# Patient Record
Sex: Female | Born: 1955 | Race: White | Hispanic: No | Marital: Married | Smoking: Never smoker
Health system: Southern US, Community
[De-identification: ages and names within clinical notes are randomized; demographics above are authoritative.]

## PROBLEM LIST (undated history)

## (undated) DIAGNOSIS — S82142A Displaced bicondylar fracture of left tibia, initial encounter for closed fracture: Secondary | ICD-10-CM

## (undated) DIAGNOSIS — T7840XA Allergy, unspecified, initial encounter: Secondary | ICD-10-CM

## (undated) DIAGNOSIS — Z1211 Encounter for screening for malignant neoplasm of colon: Secondary | ICD-10-CM

## (undated) DIAGNOSIS — C439 Malignant melanoma of skin, unspecified: Secondary | ICD-10-CM

## (undated) DIAGNOSIS — N6019 Diffuse cystic mastopathy of unspecified breast: Secondary | ICD-10-CM

## (undated) DIAGNOSIS — Z85828 Personal history of other malignant neoplasm of skin: Secondary | ICD-10-CM

## (undated) DIAGNOSIS — Z8719 Personal history of other diseases of the digestive system: Secondary | ICD-10-CM

## (undated) HISTORY — PX: TONSILLECTOMY AND ADENOIDECTOMY: SUR1326

## (undated) HISTORY — DX: Malignant melanoma of skin, unspecified: C43.9

## (undated) HISTORY — DX: Encounter for screening for malignant neoplasm of colon: Z12.11

## (undated) HISTORY — PX: APPENDECTOMY: SHX54

## (undated) HISTORY — PX: TUBAL LIGATION: SHX77

## (undated) HISTORY — PX: COLONOSCOPY: SHX174

## (undated) HISTORY — DX: Personal history of other diseases of the digestive system: Z87.19

## (undated) HISTORY — DX: Allergy, unspecified, initial encounter: T78.40XA

## (undated) HISTORY — DX: Diffuse cystic mastopathy of unspecified breast: N60.19

## (undated) HISTORY — PX: BREAST BIOPSY: SHX20

## (undated) HISTORY — DX: Personal history of other malignant neoplasm of skin: Z85.828

---

## 1999-11-15 HISTORY — PX: BREAST CYST ASPIRATION: SHX578

## 2001-11-14 DIAGNOSIS — Z8719 Personal history of other diseases of the digestive system: Secondary | ICD-10-CM

## 2001-11-14 HISTORY — DX: Personal history of other diseases of the digestive system: Z87.19

## 2002-07-26 ENCOUNTER — Ambulatory Visit (HOSPITAL_COMMUNITY): Admission: RE | Admit: 2002-07-26 | Discharge: 2002-07-26 | Payer: Self-pay | Admitting: Obstetrics and Gynecology

## 2002-07-26 ENCOUNTER — Encounter: Payer: Self-pay | Admitting: Obstetrics and Gynecology

## 2002-10-14 ENCOUNTER — Ambulatory Visit (HOSPITAL_COMMUNITY): Admission: RE | Admit: 2002-10-14 | Discharge: 2002-10-14 | Payer: Self-pay | Admitting: *Deleted

## 2002-10-17 ENCOUNTER — Encounter: Payer: Self-pay | Admitting: *Deleted

## 2002-10-17 ENCOUNTER — Ambulatory Visit (HOSPITAL_COMMUNITY): Admission: RE | Admit: 2002-10-17 | Discharge: 2002-10-17 | Payer: Self-pay | Admitting: *Deleted

## 2003-01-22 ENCOUNTER — Other Ambulatory Visit: Admission: RE | Admit: 2003-01-22 | Discharge: 2003-01-22 | Payer: Self-pay | Admitting: Obstetrics and Gynecology

## 2003-04-14 ENCOUNTER — Ambulatory Visit (HOSPITAL_COMMUNITY): Admission: RE | Admit: 2003-04-14 | Discharge: 2003-04-14 | Payer: Self-pay | Admitting: Internal Medicine

## 2003-04-14 ENCOUNTER — Encounter: Payer: Self-pay | Admitting: Internal Medicine

## 2004-03-01 ENCOUNTER — Other Ambulatory Visit: Admission: RE | Admit: 2004-03-01 | Discharge: 2004-03-01 | Payer: Self-pay | Admitting: Obstetrics and Gynecology

## 2004-08-31 ENCOUNTER — Ambulatory Visit: Payer: Self-pay | Admitting: Internal Medicine

## 2004-09-07 ENCOUNTER — Ambulatory Visit: Payer: Self-pay | Admitting: General Surgery

## 2004-11-01 ENCOUNTER — Ambulatory Visit: Payer: Self-pay | Admitting: Internal Medicine

## 2005-03-03 ENCOUNTER — Other Ambulatory Visit: Admission: RE | Admit: 2005-03-03 | Discharge: 2005-03-03 | Payer: Self-pay | Admitting: Obstetrics and Gynecology

## 2005-09-20 ENCOUNTER — Ambulatory Visit: Payer: Self-pay | Admitting: General Surgery

## 2006-01-16 ENCOUNTER — Encounter: Admission: RE | Admit: 2006-01-16 | Discharge: 2006-01-16 | Payer: Self-pay | Admitting: Gastroenterology

## 2006-09-28 ENCOUNTER — Ambulatory Visit: Payer: Self-pay | Admitting: General Surgery

## 2007-08-02 ENCOUNTER — Ambulatory Visit: Payer: Self-pay | Admitting: Gastroenterology

## 2007-10-16 ENCOUNTER — Ambulatory Visit: Payer: Self-pay | Admitting: General Surgery

## 2007-11-15 HISTORY — PX: LAPAROSCOPY: SHX197

## 2007-11-15 HISTORY — PX: COLON SURGERY: SHX602

## 2007-11-23 ENCOUNTER — Ambulatory Visit: Payer: Self-pay | Admitting: General Surgery

## 2007-12-11 ENCOUNTER — Ambulatory Visit: Payer: Self-pay | Admitting: General Surgery

## 2008-01-21 ENCOUNTER — Ambulatory Visit: Payer: Self-pay | Admitting: General Surgery

## 2008-01-31 ENCOUNTER — Ambulatory Visit: Payer: Self-pay | Admitting: General Surgery

## 2008-02-06 ENCOUNTER — Inpatient Hospital Stay: Payer: Self-pay | Admitting: General Surgery

## 2008-02-16 IMAGING — CT CT ABD-PELV W/ CM
1 of 2 series · 15 of 32 positions shown, 20 images · non-contrast
Comparison: none

REASON FOR EXAM: LLQ abdominal pain
COMMENTS:

PROCEDURE:     GOOD - GOOD ABDOMEN / PELVIS W  - November 23, 2007  [DATE]
RESULT:     The patient has a history of pelvic pain, diverticulitis,
history of prior appendectomy and hysterectomy.
There are no prior studies available for comparison.

[Series 2: soft tissue · axial · 0.59mm/px · z∈[+357,+749]mm · 15 of 55 slices shown, 20 images]
[im 3/55  soft-tissue]
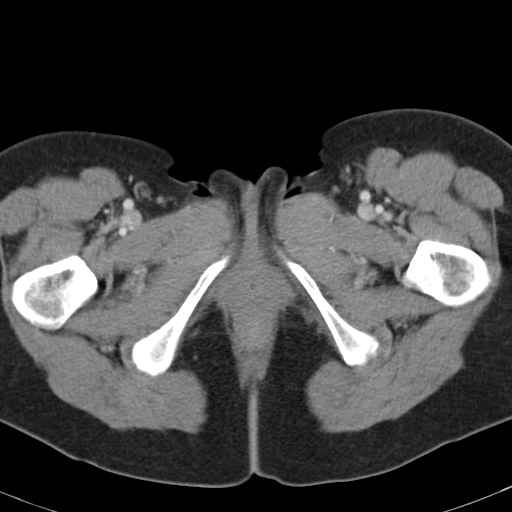
[im 3/55  bone]
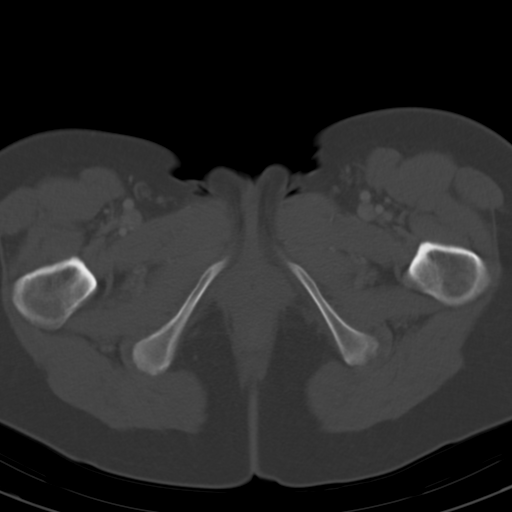
[im 8/55  soft-tissue]
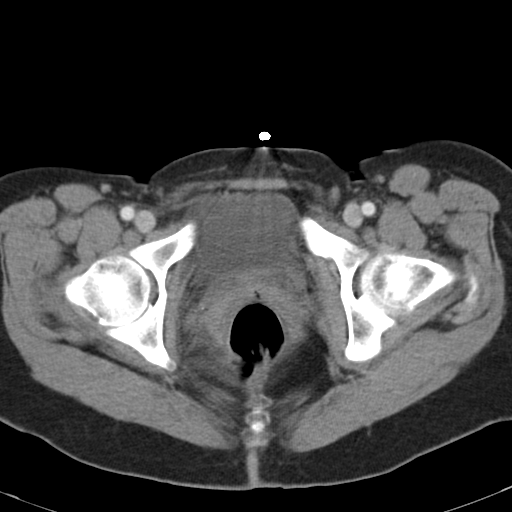
[im 10/55  soft-tissue]
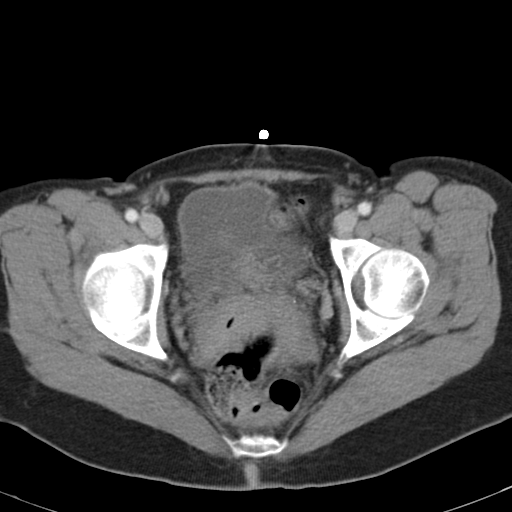
[im 15/55  soft-tissue]
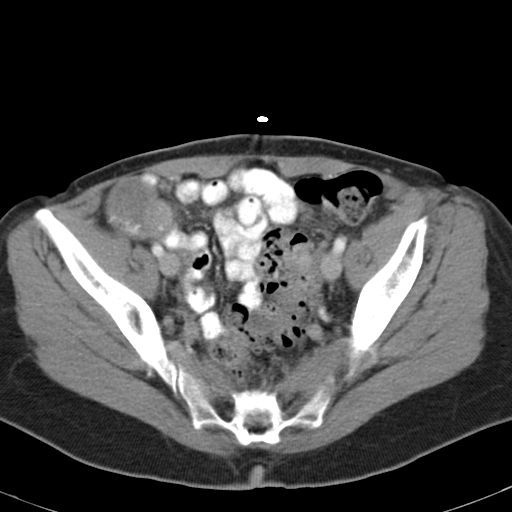
[im 18/55  soft-tissue]
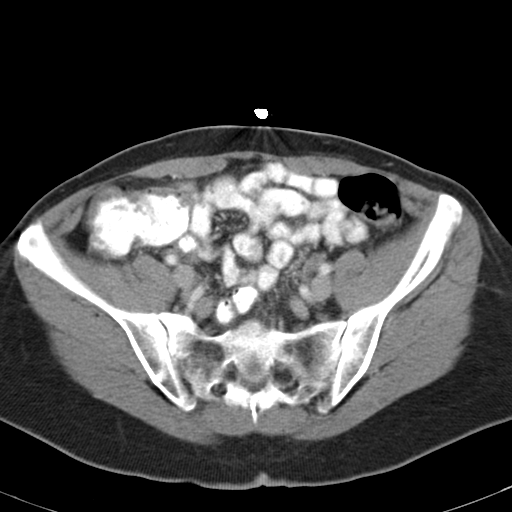
[im 23/55  soft-tissue]
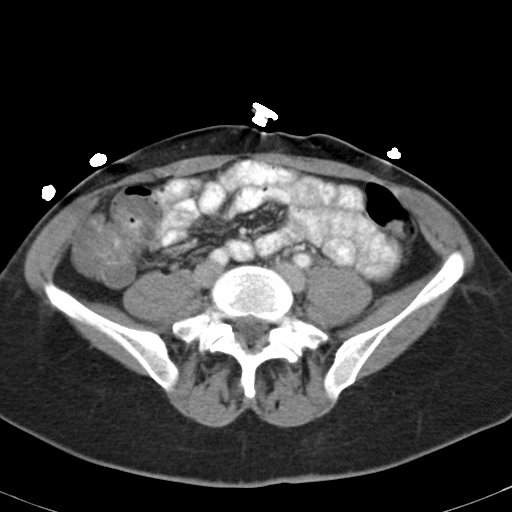
[im 25/55  soft-tissue]
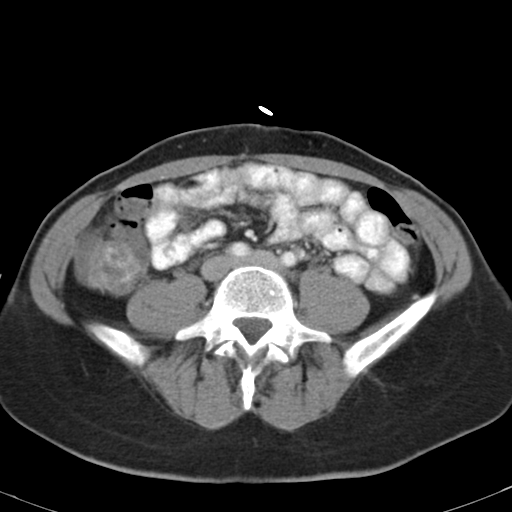
[im 30/55  soft-tissue]
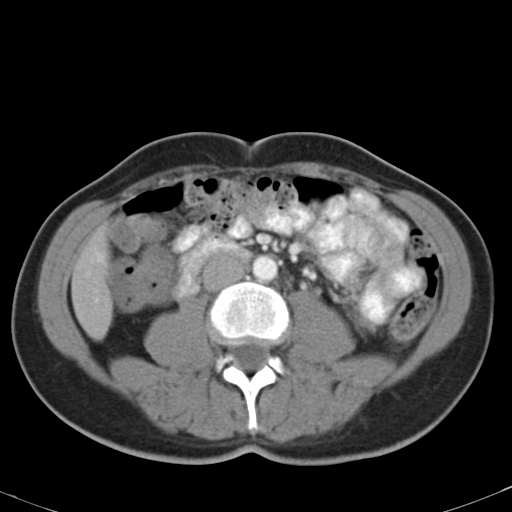
[im 32/55  soft-tissue]
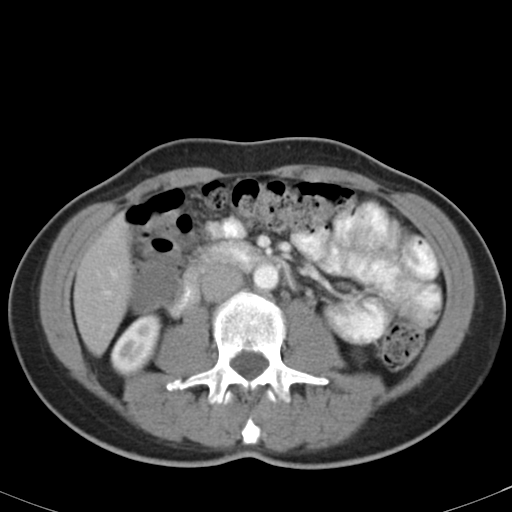
[im 32/55  bone]
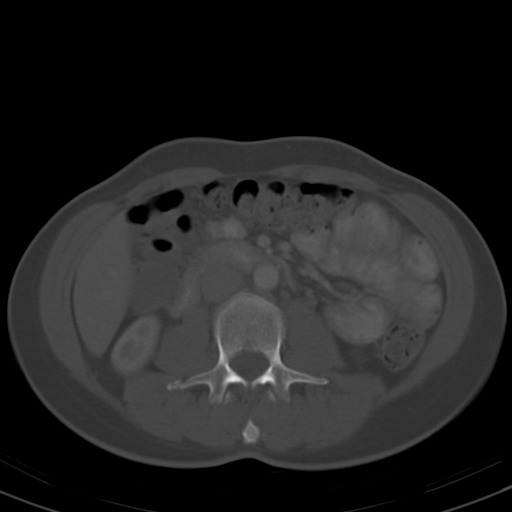
[im 37/55  soft-tissue]
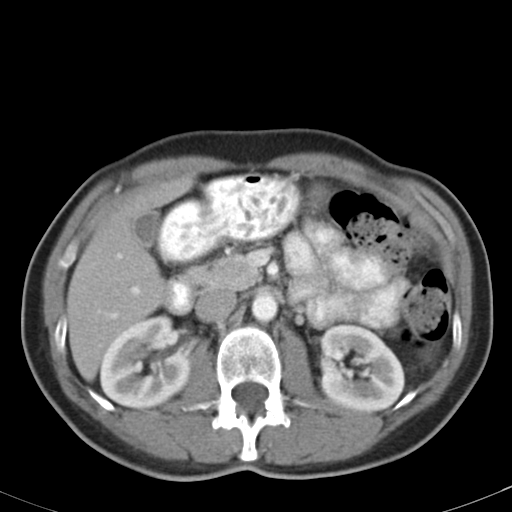
[im 40/55  soft-tissue]
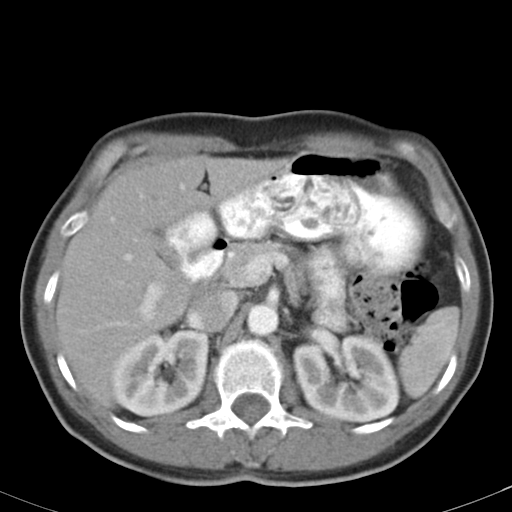
[im 45/55  soft-tissue]
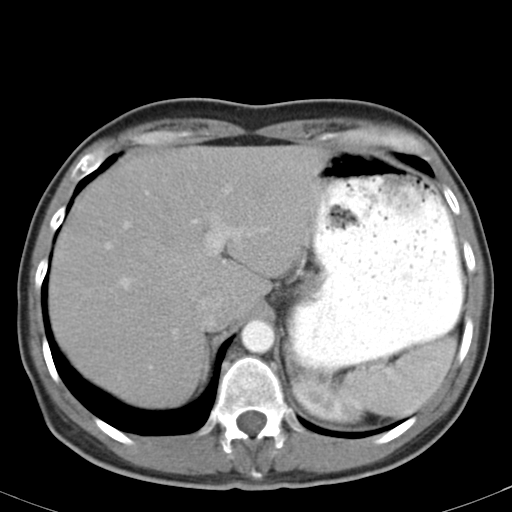
[im 45/55  lung]
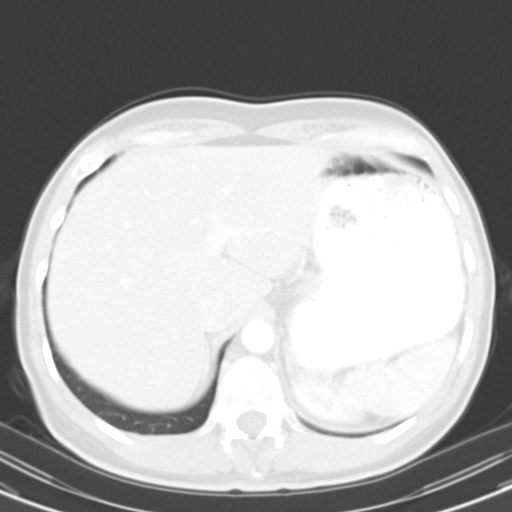
[im 47/55  soft-tissue]
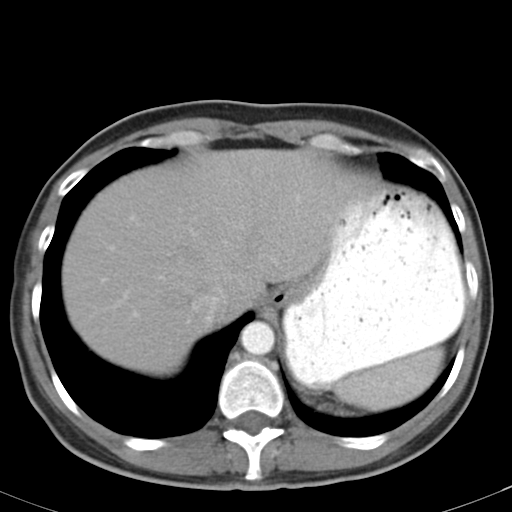
[im 47/55  lung]
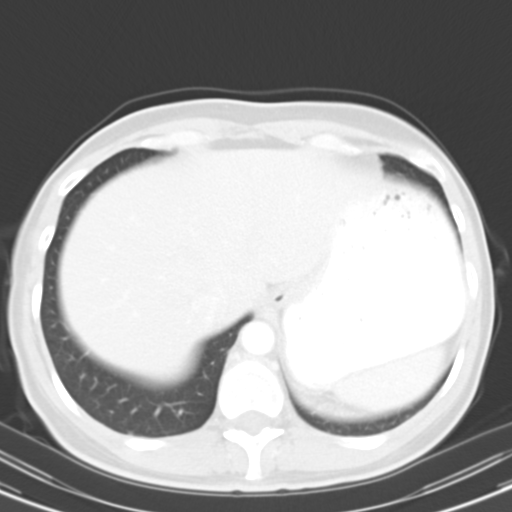
[im 50/55  lung]
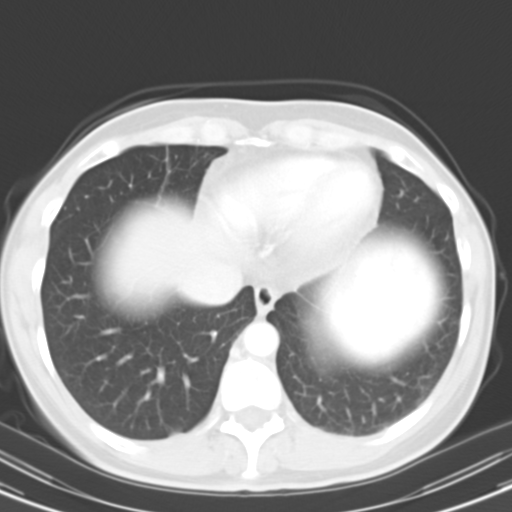
[im 52/55  soft-tissue]
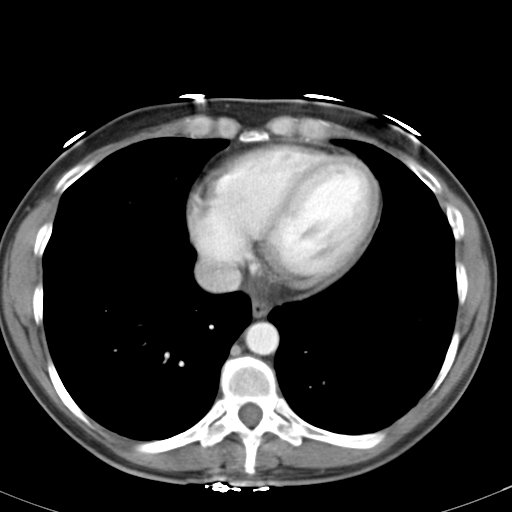
[im 52/55  lung]
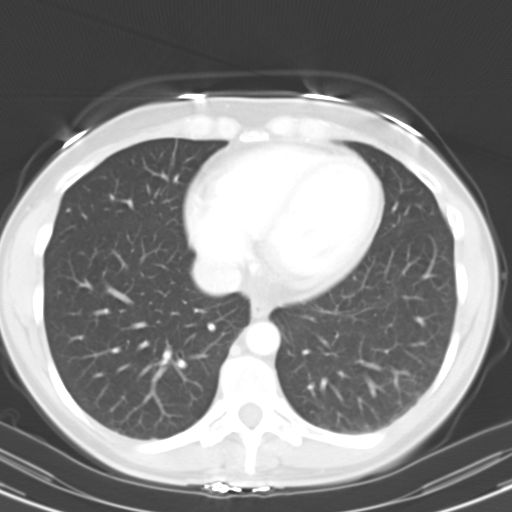

[15 of 32 positions shown; findings below may reference images not displayed]

FINDINGS: The liver and spleen are normal. The pancreas is normal. The
adrenals are normal. No focal renal abnormalities are identified. The
gallbladder is nondistended. Debris is noted in the stomach. There is no
bowel distension. No inguinal adenopathy is noted. Sigmoid colonic
diverticulosis is noted.
IMPRESSION: 1.     No acute abnormality identified.
2.     Sigmoid colonic diverticulosis.

## 2008-10-16 ENCOUNTER — Ambulatory Visit: Payer: Self-pay | Admitting: General Surgery

## 2009-07-07 ENCOUNTER — Ambulatory Visit: Payer: Self-pay | Admitting: Internal Medicine

## 2009-10-20 ENCOUNTER — Ambulatory Visit: Payer: Self-pay | Admitting: General Surgery

## 2009-10-27 ENCOUNTER — Ambulatory Visit: Payer: Self-pay | Admitting: General Surgery

## 2010-06-04 ENCOUNTER — Ambulatory Visit: Payer: Self-pay | Admitting: Internal Medicine

## 2010-08-28 IMAGING — CR CERVICAL SPINE - COMPLETE 4+ VIEW
1 series · 6 of 6 positions shown · non-contrast
Comparison: none

REASON FOR EXAM: neck pain
COMMENTS:

[Series 1: view not recorded · 0.17mm/px · 6 of 6 slices shown]
[im 1/6]
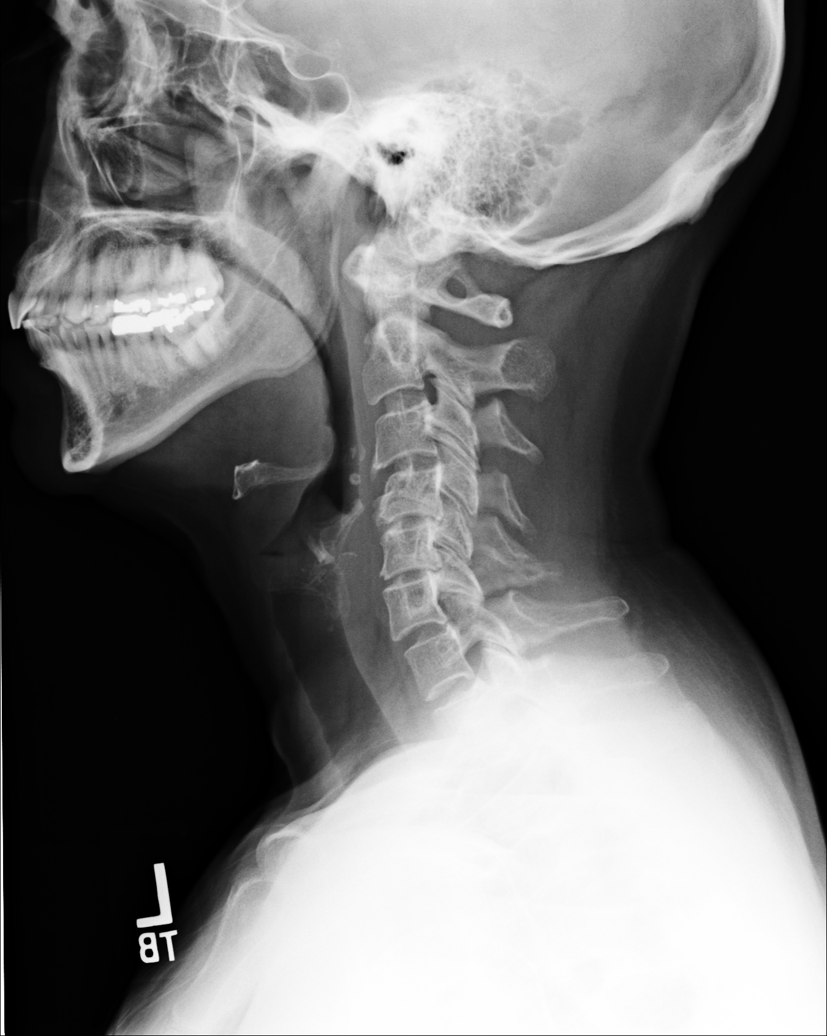
[im 2/6]
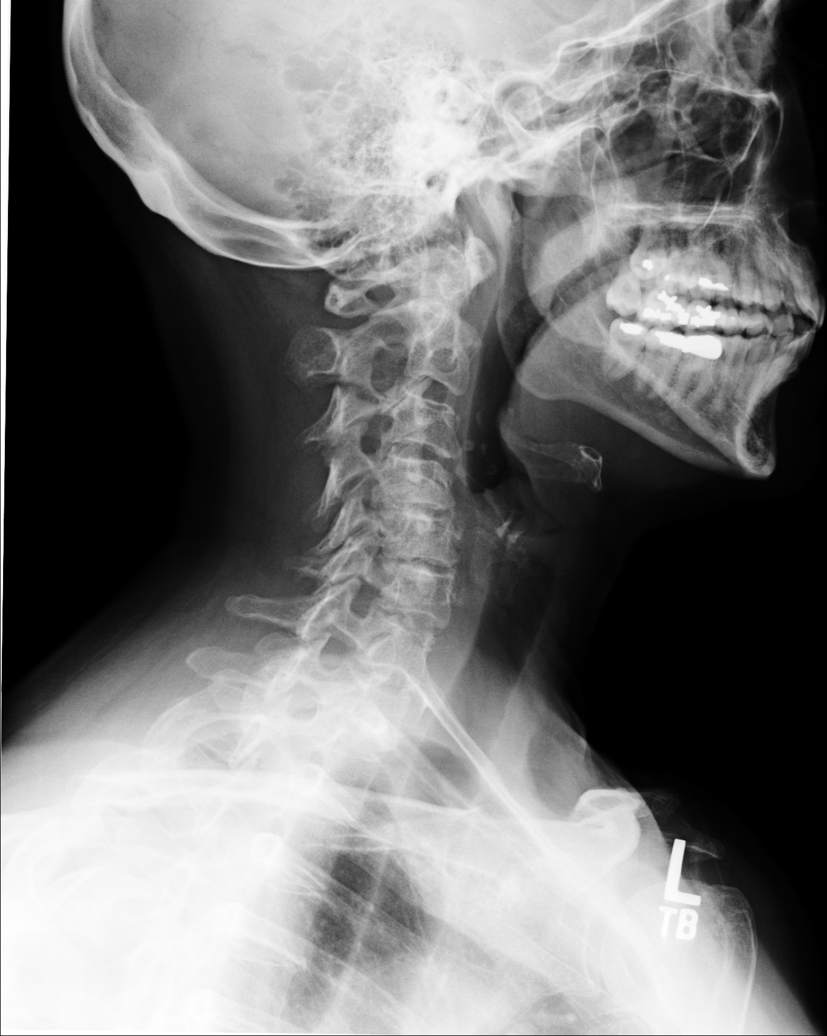
[im 3/6]
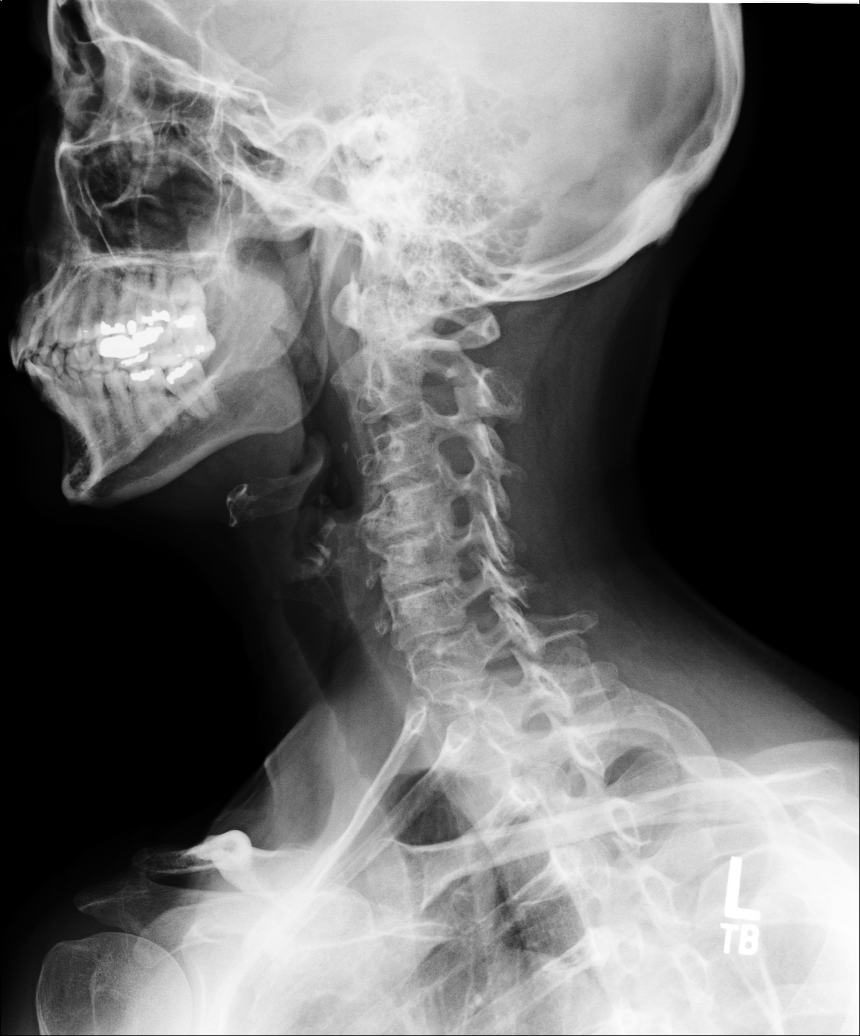
[im 4/6]
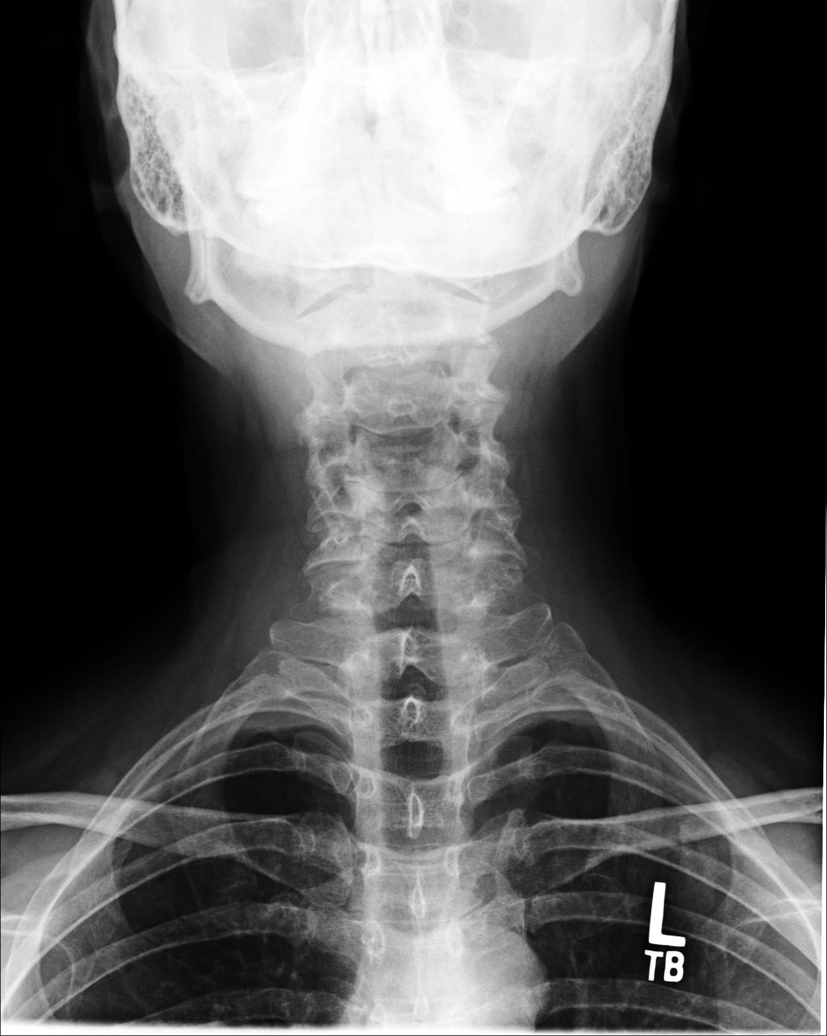
[im 5/6]
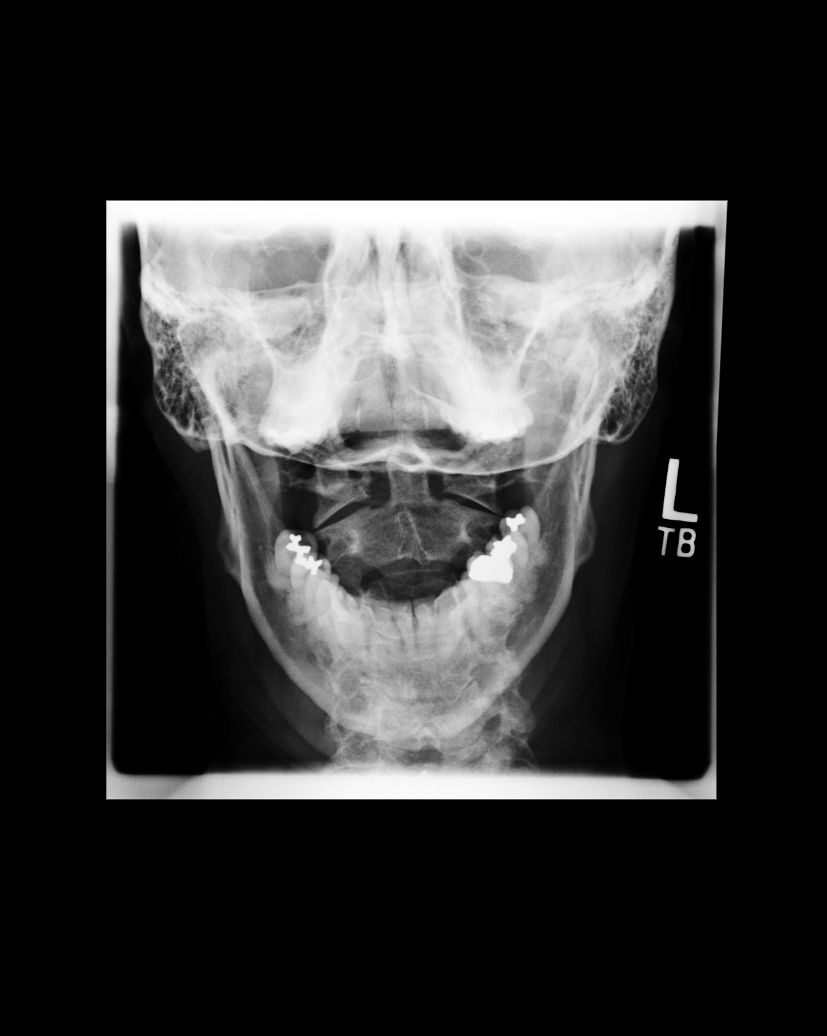
[im 6/6]
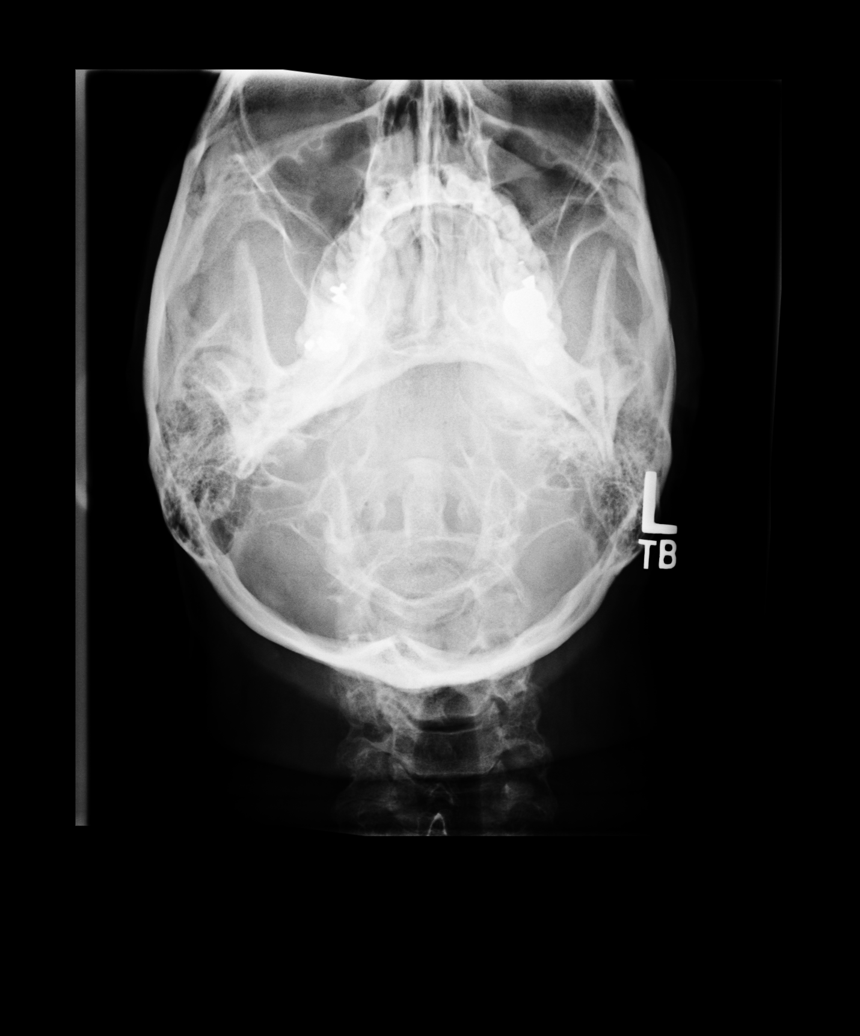

[6 of 6 positions shown; findings below may reference images not displayed]

PROCEDURE:     DXR - DXR CERVICAL SPINE COMPLETE  - June 04, 2010  [DATE]

RESULT:     The vertebral body heights are well maintained. The vertebral
body alignment is normal. There is narrowing of the C4-5 and C5-6 cervical
disc spaces compatible with cervical disc disease. There is spur impingement
on the neural foramina at both levels on the left. The neural foramina
otherwise are widely patent bilaterally. The odontoid process is intact. No
cervical rib formation is seen. In the lateral view, there is noted reversal
of the usual lordotic curvature. This is a nonspecific finding which can be
chronic or secondary to cervical muscle spasm.
IMPRESSION: 1.  No fracture is seen.
2.  There is mild narrowing of the C4-5 and C5-6 cervical disc spaces
compatible with cervical disc disease and with there being spur impingement
at both levels of the neural foramina on the right.
3.  There is reversal of the usual lordotic curvature as noted above.
4.  No cervical rib formation is seen.
5.  No lytic or blastic lesions are noted.

## 2010-10-28 ENCOUNTER — Ambulatory Visit: Payer: Self-pay | Admitting: General Surgery

## 2010-11-01 ENCOUNTER — Ambulatory Visit: Payer: Self-pay | Admitting: General Surgery

## 2011-11-02 ENCOUNTER — Ambulatory Visit: Payer: Self-pay | Admitting: General Surgery

## 2011-11-15 DIAGNOSIS — C439 Malignant melanoma of skin, unspecified: Secondary | ICD-10-CM

## 2011-11-15 HISTORY — PX: MELANOMA EXCISION: SHX5266

## 2011-11-15 HISTORY — DX: Malignant melanoma of skin, unspecified: C43.9

## 2011-11-15 HISTORY — PX: INGUINAL LYMPH NODE BIOPSY: SHX5865

## 2012-11-05 ENCOUNTER — Ambulatory Visit: Payer: Self-pay | Admitting: General Surgery

## 2012-11-14 DIAGNOSIS — Z85828 Personal history of other malignant neoplasm of skin: Secondary | ICD-10-CM

## 2012-11-14 HISTORY — DX: Personal history of other malignant neoplasm of skin: Z85.828

## 2013-05-13 ENCOUNTER — Encounter: Payer: Self-pay | Admitting: *Deleted

## 2013-05-13 DIAGNOSIS — Z8719 Personal history of other diseases of the digestive system: Secondary | ICD-10-CM | POA: Insufficient documentation

## 2013-05-13 DIAGNOSIS — C439 Malignant melanoma of skin, unspecified: Secondary | ICD-10-CM | POA: Insufficient documentation

## 2013-11-18 ENCOUNTER — Encounter: Payer: Self-pay | Admitting: General Surgery

## 2013-11-18 ENCOUNTER — Ambulatory Visit: Payer: Self-pay | Admitting: General Surgery

## 2013-11-25 ENCOUNTER — Ambulatory Visit: Payer: Self-pay | Admitting: General Surgery

## 2013-12-03 ENCOUNTER — Encounter: Payer: Self-pay | Admitting: General Surgery

## 2013-12-03 ENCOUNTER — Other Ambulatory Visit: Payer: BC Managed Care – PPO

## 2013-12-03 ENCOUNTER — Ambulatory Visit (INDEPENDENT_AMBULATORY_CARE_PROVIDER_SITE_OTHER): Payer: BC Managed Care – PPO | Admitting: General Surgery

## 2013-12-03 VITALS — BP 130/72 | HR 72 | Resp 12 | Ht 63.0 in | Wt 142.0 lb

## 2013-12-03 DIAGNOSIS — N6019 Diffuse cystic mastopathy of unspecified breast: Secondary | ICD-10-CM

## 2013-12-03 DIAGNOSIS — Z8371 Family history of colonic polyps: Secondary | ICD-10-CM

## 2013-12-03 DIAGNOSIS — N63 Unspecified lump in unspecified breast: Secondary | ICD-10-CM

## 2013-12-03 NOTE — Progress Notes (Signed)
Patient ID: BOB EASTWOOD, female   DOB: 25-Feb-1956, 58 y.o.   MRN: [id number]  Chief Complaint  Patient presents with  . Follow-up    mammogram    HPI ROSALIN BUSTER is a 58 y.o. female.  who presents for her annual breast evaluation. The most recent mammogram was done on 11-18-13.  Patient does perform regular self breast checks and gets regular mammograms done.  No new breast issues. Known history of fibrocystic breast disease. She is status post sigmoid resection in 2009.   HPI  Past Medical History  Diagnosis Date  . Allergy     seasonal  . Diffuse cystic mastopathy   . History of diverticulitis of colon 2003  . Special screening for malignant neoplasms, colon   . Personal history of other malignant neoplasm of skin 2014  . Melanoma 2013    excised from left hip region-069mm deep, sn positive. Subsequently had left inguinal node dissection and interferon therapy    Past Surgical History  Procedure Laterality Date  . Melanoma excision Left 2013    excised from hip region-0.41mm deep, sentinel node positive. Subsequently had left inquinal node dissection and interferon therapy  . Inguinal lymph node biopsy Left 2013    left inguinal node dissection,positve sn d/t melanoma left hip region.  . Colonoscopy  2008    Dr. Jamal Collin  . Tubal ligation    . Tonsillectomy and adenoidectomy    . Cesarean section    . Appendectomy    . Laparoscopy  2009  . Colon surgery  2009    colectomy-sigmoid, hand assist    Family History  Problem Relation Age of Onset  . Cancer Father     cancer of throat, cancer of lung  . Breast cancer Maternal Aunt   . Breast cancer Maternal Aunt   . Colon polyps Mother     Social History History  Substance Use Topics  . Smoking status: Never Smoker   . Smokeless tobacco: Not on file  . Alcohol Use: Yes     Comment: ocassionally    No Known Allergies  Current Outpatient Prescriptions  Medication Sig Dispense Refill  . Biotin 1000 MCG tablet  Take 1,000 mcg by mouth daily.      . calcium carbonate (OS-CAL) 600 MG TABS tablet Take 600 mg by mouth daily with breakfast.      . cholecalciferol (VITAMIN D) 1000 UNITS tablet Take 1,000 Units by mouth daily.      . clonazePAM (KLONOPIN) 0.5 MG tablet Take 0.5 mg by mouth daily.       . fexofenadine (ALLEGRA ALLERGY) 180 MG tablet Take 180 mg by mouth daily.      Marland Kitchen ibandronate (BONIVA) 150 MG tablet Take 150 mg by mouth every 30 (thirty) days.       . Misc Natural Products (GLUCOSAMINE CHONDROITIN ADV PO) Take by mouth daily.      . Omega-3 Fatty Acids (FISH OIL EXTRA STRENGTH) 435 MG CAPS Take 1,400 mg by mouth daily.      . pantoprazole (PROTONIX) 40 MG tablet       . traMADol (ULTRAM) 50 MG tablet        No current facility-administered medications for this visit.    Review of Systems Review of Systems  Constitutional: Negative.   Respiratory: Negative.   Cardiovascular: Negative.     Blood pressure 130/72, pulse 72, resp. rate 12, height 5\' 3"  (1.6 m), weight 142 lb (64.411 kg).  Physical Exam Physical  Exam  Constitutional: She is oriented to person, place, and time. She appears well-developed and well-nourished.  Eyes: Conjunctivae are normal. No scleral icterus.  Neck: Neck supple.  Cardiovascular: Normal rate, regular rhythm and normal heart sounds.   Mild left lower leg edema. Controlled with compression hose.  Pulmonary/Chest: Effort normal and breath sounds normal. Right breast exhibits no inverted nipple, no mass, no nipple discharge, no skin change and no tenderness. Left breast exhibits no inverted nipple, no mass, no nipple discharge, no skin change and no tenderness.  Mild fullness left breast upper outer quadrant.  Abdominal: Soft. Bowel sounds are normal. There is no hepatosplenomegaly. There is no tenderness. No hernia.  Lymphadenopathy:    She has no cervical adenopathy.    She has no axillary adenopathy.  Neurological: She is alert and oriented to person,  place, and time.  Skin: Skin is warm and dry.    Data Reviewed Mammogram reviewed and stable. [place] left breast in uoq revealed a small oval shaped hypoechoic mass.   Assessment    Mild left lower leg edema. Controlled with compression hose otherwise stable physical exam. History of melanoma left hip, s/p wide excision, node dissection.    PlanFNA of left  Breast mass done today-no fluid noted . Cytology sent    3-4 months follow up if cytology is benign..      Patient will call to schedule colonoscopy once breast issues have resolved. We can go ahead and arrange colonoscopy once patient is ready to schedule.   SANKAR,SEEPLAPUTHUR G 12/10/2013, 6:03 AM

## 2013-12-03 NOTE — Patient Instructions (Addendum)
Continue self breast exams. Call office for any new breast issues or concerns. 

## 2013-12-05 LAB — FINE-NEEDLE ASPIRATION

## 2013-12-06 ENCOUNTER — Telehealth: Payer: Self-pay | Admitting: *Deleted

## 2013-12-06 NOTE — Telephone Encounter (Signed)
Message copied by Dominga Ferry on Fri Dec 06, 2013  8:45 AM ------      Message from: Christene Lye      Created: Fri Dec 06, 2013  8:11 AM       Please inform pt cytology was inconclusive. She needs core biopsy. Please schedule this. ------

## 2013-12-06 NOTE — Telephone Encounter (Signed)
Patient notified as instructed. She verbalizes understanding. This patient has been scheduled for a left breast core biopsy for 12-12-13.

## 2013-12-10 ENCOUNTER — Encounter: Payer: Self-pay | Admitting: General Surgery

## 2013-12-10 DIAGNOSIS — N6019 Diffuse cystic mastopathy of unspecified breast: Secondary | ICD-10-CM | POA: Insufficient documentation

## 2013-12-10 DIAGNOSIS — N63 Unspecified lump in unspecified breast: Secondary | ICD-10-CM | POA: Insufficient documentation

## 2013-12-12 ENCOUNTER — Ambulatory Visit (INDEPENDENT_AMBULATORY_CARE_PROVIDER_SITE_OTHER): Payer: BC Managed Care – PPO | Admitting: General Surgery

## 2013-12-12 ENCOUNTER — Ambulatory Visit: Payer: BC Managed Care – PPO

## 2013-12-12 ENCOUNTER — Encounter: Payer: Self-pay | Admitting: General Surgery

## 2013-12-12 VITALS — BP 140/78 | HR 82 | Resp 14 | Ht 63.0 in | Wt 144.0 lb

## 2013-12-12 DIAGNOSIS — N63 Unspecified lump in unspecified breast: Secondary | ICD-10-CM

## 2013-12-12 NOTE — Progress Notes (Signed)
This is a 58 year old female here today for an left breast core biopsy. FNA from left breast mass was inadequate for evaluation. Core biopsy completed with clip placed.  Assuming path is benign will see her back in 3 mos.

## 2013-12-12 NOTE — Patient Instructions (Signed)

## 2013-12-14 LAB — PATHOLOGY

## 2013-12-17 ENCOUNTER — Telehealth: Payer: Self-pay | Admitting: *Deleted

## 2013-12-17 NOTE — Telephone Encounter (Signed)
Message copied by Carson Myrtle on Tue Dec 17, 2013  8:19 AM ------      Message from: Dominga Ferry      Created: Mon Dec 16, 2013  4:48 PM       Dr. Jamal Collin said he sent you an in basket message on this patient. He said let her know this is benign and that she will not need to follow up in 3 months with an ultrasound as originally instructed. We will place her in the recalls for one year with a bilateral screening mammogram. Thanks!  ------

## 2013-12-17 NOTE — Telephone Encounter (Signed)
Notified patient as instructed, patient pleased. Discussed follow-up appointments, patient agrees. She will call back to schedule her colonoscopy[y when she knows her schedule.

## 2013-12-19 ENCOUNTER — Telehealth: Payer: Self-pay | Admitting: *Deleted

## 2013-12-19 DIAGNOSIS — Z1211 Encounter for screening for malignant neoplasm of colon: Secondary | ICD-10-CM

## 2013-12-19 MED ORDER — POLYETHYLENE GLYCOL 3350 17 GM/SCOOP PO POWD: ORAL | Status: DC

## 2013-12-19 NOTE — Telephone Encounter (Signed)
Patient has been scheduled for a colonoscopy on 01-22-14 at [facility].  Miralax prescription has been sent to patient's pharmacy.  Paperwork will be mailed to the patient per her request. She was instructed to call the office should she have further questions.

## 2014-01-20 ENCOUNTER — Other Ambulatory Visit: Payer: Self-pay | Admitting: General Surgery

## 2014-01-20 DIAGNOSIS — Z8371 Family history of colonic polyps: Secondary | ICD-10-CM

## 2014-01-22 ENCOUNTER — Ambulatory Visit: Payer: Self-pay | Admitting: General Surgery

## 2014-01-22 DIAGNOSIS — K5730 Diverticulosis of large intestine without perforation or abscess without bleeding: Secondary | ICD-10-CM

## 2014-01-22 DIAGNOSIS — D126 Benign neoplasm of colon, unspecified: Secondary | ICD-10-CM

## 2014-01-22 DIAGNOSIS — Z8371 Family history of colonic polyps: Secondary | ICD-10-CM

## 2014-01-23 LAB — PATHOLOGY REPORT

## 2014-01-24 ENCOUNTER — Encounter: Payer: Self-pay | Admitting: General Surgery

## 2014-01-28 ENCOUNTER — Telehealth: Payer: Self-pay | Admitting: *Deleted

## 2014-01-28 NOTE — Telephone Encounter (Signed)
Notified patient as instructed, patient pleased. Discussed follow-up appointments, patient agrees. Recalls for 5 years.

## 2014-01-28 NOTE — Telephone Encounter (Signed)
Message copied by Carson Myrtle on Tue Jan 28, 2014 10:24 AM ------      Message from: Christene Lye      Created: Tue Jan 28, 2014  9:07 AM       Please let pt pt know the pathology  was normal on her colon polyp. ------

## 2014-09-15 ENCOUNTER — Encounter: Payer: Self-pay | Admitting: General Surgery

## 2014-12-08 ENCOUNTER — Ambulatory Visit: Payer: Self-pay | Admitting: General Surgery

## 2014-12-09 ENCOUNTER — Encounter: Payer: Self-pay | Admitting: General Surgery

## 2014-12-16 ENCOUNTER — Ambulatory Visit: Payer: Self-pay | Admitting: General Surgery

## 2014-12-18 ENCOUNTER — Ambulatory Visit (INDEPENDENT_AMBULATORY_CARE_PROVIDER_SITE_OTHER): Payer: BLUE CROSS/BLUE SHIELD | Admitting: General Surgery

## 2014-12-18 ENCOUNTER — Encounter: Payer: Self-pay | Admitting: General Surgery

## 2014-12-18 VITALS — BP 122/70 | HR 84 | Resp 14 | Ht 63.0 in | Wt 144.0 lb

## 2014-12-18 DIAGNOSIS — Z8719 Personal history of other diseases of the digestive system: Secondary | ICD-10-CM

## 2014-12-18 DIAGNOSIS — Z83719 Family history of colon polyps, unspecified: Secondary | ICD-10-CM

## 2014-12-18 DIAGNOSIS — N6019 Diffuse cystic mastopathy of unspecified breast: Secondary | ICD-10-CM

## 2014-12-18 DIAGNOSIS — Z8371 Family history of colonic polyps: Secondary | ICD-10-CM

## 2014-12-18 DIAGNOSIS — Z85820 Personal history of malignant melanoma of skin: Secondary | ICD-10-CM

## 2014-12-18 NOTE — Progress Notes (Signed)
Patient ID: Christine Arias, female   DOB: 1956-10-24, 59 y.o.   MRN: [id number]  Chief Complaint  Patient presents with  . Follow-up    mammogram     HPI Christine Arias is a 59 y.o. female who presents for a breast evaluation. The most recent mammogram was done on 12/08/14. Patient does perform regular self breast checks and gets regular mammograms done. The patient denies any new problems at this time.    HPI  Past Medical History  Diagnosis Date  . Allergy     seasonal  . Diffuse cystic mastopathy   . History of diverticulitis of colon 2003  . Special screening for malignant neoplasms, colon   . Personal history of other malignant neoplasm of skin 2014  . Melanoma 2013    excised from left hip region-046mm deep, sn positive. Subsequently had left inguinal node dissection and interferon therapy    Past Surgical History  Procedure Laterality Date  . Melanoma excision Left 2013    excised from hip region-0.34mm deep, sentinel node positive. Subsequently had left inquinal node dissection and interferon therapy  . Inguinal lymph node biopsy Left 2013    left inguinal node dissection,positve sn d/t melanoma left hip region.  . Colonoscopy  2008, 2015    Dr. Jamal Collin  . Tubal ligation    . Tonsillectomy and adenoidectomy    . Cesarean section    . Appendectomy    . Laparoscopy  2009  . Colon surgery  2009    colectomy-sigmoid, hand assist    Family History  Problem Relation Age of Onset  . Cancer Father     cancer of throat, cancer of lung  . Breast cancer Maternal Aunt   . Breast cancer Maternal Aunt   . Colon polyps Mother     Social History History  Substance Use Topics  . Smoking status: Never Smoker   . Smokeless tobacco: Not on file  . Alcohol Use: Yes     Comment: ocassionally    No Known Allergies  Current Outpatient Prescriptions  Medication Sig Dispense Refill  . Biotin 1000 MCG tablet Take 1,000 mcg by mouth daily.    . calcium carbonate (OS-CAL) 600  MG TABS tablet Take 600 mg by mouth daily with breakfast.    . cholecalciferol (VITAMIN D) 1000 UNITS tablet Take 1,000 Units by mouth daily.    . fexofenadine (ALLEGRA ALLERGY) 180 MG tablet Take 180 mg by mouth daily.    Marland Kitchen ibandronate (BONIVA) 150 MG tablet Take 150 mg by mouth every 30 (thirty) days.     . Misc Natural Products (GLUCOSAMINE CHONDROITIN ADV PO) Take by mouth daily.    . Omega-3 Fatty Acids (FISH OIL EXTRA STRENGTH) 435 MG CAPS Take 1,400 mg by mouth daily.    . pantoprazole (PROTONIX) 40 MG tablet      No current facility-administered medications for this visit.    Review of Systems Review of Systems  Constitutional: Negative.   Respiratory: Negative.   Cardiovascular: Negative.     Blood pressure 122/70, pulse 84, resp. rate 14, height 5\' 3"  (1.6 m), weight 144 lb (65.318 kg).  Physical Exam Physical Exam  Constitutional: She is oriented to person, place, and time. She appears well-developed and well-nourished.  Eyes: Conjunctivae are normal. No scleral icterus.  Neck: Neck supple.  Cardiovascular: Normal rate, regular rhythm and normal heart sounds.   Pulmonary/Chest: Effort normal and breath sounds normal. Right breast exhibits no inverted nipple, no mass, no  nipple discharge, no skin change and no tenderness. Left breast exhibits no inverted nipple, no mass, no nipple discharge, no skin change and no tenderness.  Abdominal: Soft. Normal appearance. There is no hepatomegaly. There is no tenderness.  Lymphadenopathy:    She has no cervical adenopathy.    She has no axillary adenopathy.  Neurological: She is alert and oriented to person, place, and time.  Skin: Skin is warm and dry.    Data Reviewed Mammogram reviewed and stable.  Assessment    Stable physical exam. History of melanoma, diverticulitis, FCD, and FH of colon polyps. Colonoscopy last yr was normal.    Plan    Patient to return in one year for bilateral screening mammogram.        SANKAR,SEEPLAPUTHUR G 12/19/2014, 8:43 AM

## 2014-12-18 NOTE — Patient Instructions (Signed)
Continue self breast exams. Call office for any new breast issues or concerns. Patient to return in one year for bilateral screening mammogram.

## 2014-12-19 ENCOUNTER — Encounter: Payer: Self-pay | Admitting: General Surgery

## 2014-12-19 DIAGNOSIS — Z8371 Family history of colonic polyps: Secondary | ICD-10-CM | POA: Insufficient documentation

## 2014-12-19 DIAGNOSIS — Z85820 Personal history of malignant melanoma of skin: Secondary | ICD-10-CM | POA: Insufficient documentation

## 2014-12-19 DIAGNOSIS — Z8719 Personal history of other diseases of the digestive system: Secondary | ICD-10-CM | POA: Insufficient documentation

## 2015-06-22 ENCOUNTER — Ambulatory Visit (INDEPENDENT_AMBULATORY_CARE_PROVIDER_SITE_OTHER): Payer: BLUE CROSS/BLUE SHIELD | Admitting: General Surgery

## 2015-06-22 ENCOUNTER — Encounter: Payer: Self-pay | Admitting: General Surgery

## 2015-06-22 VITALS — BP 130/78 | HR 77 | Resp 14 | Ht 63.0 in | Wt 144.0 lb

## 2015-06-22 DIAGNOSIS — N644 Mastodynia: Secondary | ICD-10-CM | POA: Diagnosis not present

## 2015-06-22 NOTE — Patient Instructions (Signed)
If pain reoccurs in same fashion, call office. Otherwise, follow up as scheduled.

## 2015-06-22 NOTE — Progress Notes (Signed)
Patient ID: Christine Arias, female   DOB: 1956/09/14, 59 y.o.   MRN: [id number]  Chief Complaint  Patient presents with  . Other    left breast pain    HPI Christine Arias is a 59 y.o. female here today for a evaluation of left breast pain . She reports that she has been having pain in both breasts for a few weeks. Last Monday she reports sharp pain in the nipple area of the left breast that has occurred twice. Last mammogram was 11/2014.  HPI  Past Medical History  Diagnosis Date  . Allergy     seasonal  . Diffuse cystic mastopathy   . History of diverticulitis of colon 2003  . Special screening for malignant neoplasms, colon   . Personal history of other malignant neoplasm of skin 2014  . Melanoma 2013    excised from left hip region-066mm deep, sn positive. Subsequently had left inguinal node dissection and interferon therapy    Past Surgical History  Procedure Laterality Date  . Melanoma excision Left 2013    excised from hip region-0.44mm deep, sentinel node positive. Subsequently had left inquinal node dissection and interferon therapy  . Inguinal lymph node biopsy Left 2013    left inguinal node dissection,positve sn d/t melanoma left hip region.  . Colonoscopy  2008, 2015    Dr. Jamal Collin  . Tubal ligation    . Tonsillectomy and adenoidectomy    . Cesarean section    . Appendectomy    . Laparoscopy  2009  . Colon surgery  2009    colectomy-sigmoid, hand assist    Family History  Problem Relation Age of Onset  . Cancer Father     cancer of throat, cancer of lung  . Breast cancer Maternal Aunt   . Breast cancer Maternal Aunt   . Colon polyps Mother     Social History History  Substance Use Topics  . Smoking status: Never Smoker   . Smokeless tobacco: Never Used  . Alcohol Use: 0.0 oz/week    0 Standard drinks or equivalent per week     Comment: ocassionally    No Known Allergies  Current Outpatient Prescriptions  Medication Sig Dispense Refill  . Biotin  1000 MCG tablet Take 1,000 mcg by mouth daily.    . calcium carbonate (OS-CAL) 600 MG TABS tablet Take 600 mg by mouth daily with breakfast.    . esomeprazole (NEXIUM) 40 MG capsule Take 40 mg by mouth daily at 12 noon.    . fexofenadine (ALLEGRA ALLERGY) 180 MG tablet Take 180 mg by mouth daily.    Marland Kitchen ibandronate (BONIVA) 150 MG tablet Take 150 mg by mouth every 30 (thirty) days.     . Misc Natural Products (GLUCOSAMINE CHONDROITIN ADV PO) Take by mouth daily.    . Omega-3 Fatty Acids (FISH OIL EXTRA STRENGTH) 435 MG CAPS Take 1,400 mg by mouth daily.     No current facility-administered medications for this visit.    Review of Systems Review of Systems  Constitutional: Negative.   Respiratory: Negative.   Cardiovascular: Negative.     Blood pressure 130/78, pulse 77, resp. rate 14, height 5\' 3"  (1.6 m), weight 144 lb (65.318 kg).  Physical Exam Physical Exam  Constitutional: She is oriented to person, place, and time. She appears well-developed and well-nourished.  Eyes: Conjunctivae are normal. No scleral icterus.  Neck: Neck supple.  Pulmonary/Chest: Right breast exhibits tenderness. Right breast exhibits no inverted nipple, no mass, no  nipple discharge and no skin change. Left breast exhibits tenderness. Left breast exhibits no inverted nipple, no mass, no nipple discharge and no skin change.  Lymphadenopathy:    She has no cervical adenopathy.    She has no axillary adenopathy.  Neurological: She is alert and oriented to person, place, and time.  Skin: Skin is warm and dry.    Data Reviewed   Assessment    Mastalgia, bilaterally.2-3 episodes of pain in central left breast-sharp, fleeting. No findings on exam. No need for any imaging at present     Plan    If pain reoccurs in same fashion, call office. Otherwise, follow up as scheduled.      PCP:  Trudie Reed 06/22/2015, 4:29 PM

## 2015-10-01 ENCOUNTER — Other Ambulatory Visit: Payer: Self-pay | Admitting: *Deleted

## 2015-10-01 DIAGNOSIS — Z1231 Encounter for screening mammogram for malignant neoplasm of breast: Secondary | ICD-10-CM

## 2015-12-10 ENCOUNTER — Ambulatory Visit
Admission: RE | Admit: 2015-12-10 | Discharge: 2015-12-10 | Disposition: A | Discharge status: Home / Self Care | Payer: BLUE CROSS/BLUE SHIELD | Source: Ambulatory Visit | Attending: General Surgery | Admitting: General Surgery

## 2015-12-10 ENCOUNTER — Other Ambulatory Visit: Payer: Self-pay | Admitting: General Surgery

## 2015-12-10 DIAGNOSIS — Z1231 Encounter for screening mammogram for malignant neoplasm of breast: Secondary | ICD-10-CM | POA: Diagnosis not present

## 2015-12-16 ENCOUNTER — Encounter: Payer: Self-pay | Admitting: General Surgery

## 2015-12-16 ENCOUNTER — Ambulatory Visit (INDEPENDENT_AMBULATORY_CARE_PROVIDER_SITE_OTHER): Payer: BLUE CROSS/BLUE SHIELD | Admitting: General Surgery

## 2015-12-16 VITALS — BP 128/74 | HR 66 | Resp 12 | Ht 63.0 in | Wt 148.0 lb

## 2015-12-16 DIAGNOSIS — N6019 Diffuse cystic mastopathy of unspecified breast: Secondary | ICD-10-CM | POA: Diagnosis not present

## 2015-12-16 DIAGNOSIS — Z9049 Acquired absence of other specified parts of digestive tract: Secondary | ICD-10-CM

## 2015-12-16 DIAGNOSIS — Z8371 Family history of colonic polyps: Secondary | ICD-10-CM

## 2015-12-16 DIAGNOSIS — Z85820 Personal history of malignant melanoma of skin: Secondary | ICD-10-CM

## 2015-12-16 NOTE — Progress Notes (Signed)
Patient ID: Christine Arias, female   DOB: 1956/07/09, 60 y.o.   MRN: [id number]  Chief Complaint  Patient presents with  . Follow-up    HPI Christine Arias is a 60 y.o. female.  who presents for a breast evaluation. The most recent mammogram was done on 12-10-15.  Patient does perform regular self breast checks and gets regular mammograms done.  No new breast issues. She wears her compression stockings daily for lymphedema following inguinal lymph node biopsy.  Bowels are regular and stable with the use of Miralax. I have reviewed the history of present illness with the patient.  HPI  Past Medical History  Diagnosis Date  . Allergy     seasonal  . Diffuse cystic mastopathy   . History of diverticulitis of colon 2003  . Special screening for malignant neoplasms, colon   . Personal history of other malignant neoplasm of skin 2014  . Melanoma ([place]) 2013    excised from left hip region-017mm deep, sn positive. Subsequently had left inguinal node dissection and interferon therapy    Past Surgical History  Procedure Laterality Date  . Melanoma excision Left 2013    excised from hip region-0.33mm deep, sentinel node positive. Subsequently had left inquinal node dissection and interferon therapy  . Inguinal lymph node biopsy Left 2013    left inguinal node dissection,positve sn d/t melanoma left hip region.  . Colonoscopy  2008, 2015    Dr. Jamal Collin  . Tubal ligation    . Tonsillectomy and adenoidectomy    . Cesarean section    . Appendectomy    . Laparoscopy  2009  . Colon surgery  2009    colectomy-sigmoid, hand assist  . Breast cyst aspiration Bilateral 2001  . Breast biopsy Left     neg core 2014    Family History  Problem Relation Age of Onset  . Cancer Father     cancer of throat, cancer of lung  . Breast cancer Maternal Aunt 6  . Breast cancer Maternal Aunt 12  . Colon polyps Mother     Social History Social History  Substance Use Topics  . Smoking status: Never  Smoker   . Smokeless tobacco: Never Used  . Alcohol Use: 0.0 oz/week    0 Standard drinks or equivalent per week     Comment: ocassionally    No Known Allergies  Current Outpatient Prescriptions  Medication Sig Dispense Refill  . Biotin 1000 MCG tablet Take 1,000 mcg by mouth daily.    . calcium carbonate (OS-CAL) 600 MG TABS tablet Take 600 mg by mouth daily with breakfast.    . esomeprazole (NEXIUM) 40 MG capsule Take 40 mg by mouth daily at 12 noon.    . fexofenadine (ALLEGRA ALLERGY) 180 MG tablet Take 180 mg by mouth daily.    Marland Kitchen ibandronate (BONIVA) 150 MG tablet Take 150 mg by mouth every 30 (thirty) days.     . Misc Natural Products (GLUCOSAMINE CHONDROITIN ADV PO) Take by mouth daily.    . Omega-3 Fatty Acids (FISH OIL EXTRA STRENGTH) 435 MG CAPS Take 1,400 mg by mouth daily.     No current facility-administered medications for this visit.    Review of Systems Review of Systems  Constitutional: Negative.   Respiratory: Negative.   Cardiovascular: Negative.     Blood pressure 128/74, pulse 66, resp. rate 12, height 5\' 3"  (1.6 m), weight 148 lb (67.132 kg).  Physical Exam Physical Exam  Constitutional: She is oriented to  person, place, and time. She appears well-developed and well-nourished.  HENT:  Mouth/Throat: Oropharynx is clear and moist.  Eyes: Conjunctivae are normal. No scleral icterus.  Neck: Neck supple.  Cardiovascular: Normal rate, regular rhythm and normal heart sounds.   Pulmonary/Chest: Effort normal and breath sounds normal. Right breast exhibits no inverted nipple, no mass, no nipple discharge, no skin change and no tenderness. Left breast exhibits no inverted nipple, no mass, no nipple discharge, no skin change and no tenderness.  Abdominal: Soft. Normal appearance. There is no hepatosplenomegaly. There is no tenderness.  Lymphadenopathy:    She has no cervical adenopathy.    She has no axillary adenopathy.  Neurological: She is alert and oriented  to person, place, and time.  Skin: Skin is warm and dry.    Data Reviewed Notes and mammogram reviewed and stable.  Assessment    FCD-stable  History of melanoma History of sigmoid colon resection Family history of colonic polyps    Plan    Patient to return in one year for bilateral screening mammogram.     PCP:  Benita Stabile  This information has been scribed by Karie Fetch RNBC.   SANKAR,SEEPLAPUTHUR G 12/18/2015, 6:18 AM

## 2015-12-16 NOTE — Patient Instructions (Addendum)
The patient is aware to call back for any questions or concerns. Continue monthly self-breast exams. Return in one year for bilateral screening mammogram.

## 2015-12-18 ENCOUNTER — Encounter: Payer: Self-pay | Admitting: General Surgery

## 2016-04-28 ENCOUNTER — Encounter: Payer: Self-pay | Admitting: General Surgery

## 2016-04-28 ENCOUNTER — Ambulatory Visit
Admission: RE | Admit: 2016-04-28 | Discharge: 2016-04-28 | Disposition: A | Discharge status: Home / Self Care | Payer: BLUE CROSS/BLUE SHIELD | Source: Ambulatory Visit | Attending: General Surgery | Admitting: General Surgery

## 2016-04-28 ENCOUNTER — Telehealth: Payer: Self-pay | Admitting: *Deleted

## 2016-04-28 ENCOUNTER — Encounter: Payer: Self-pay | Admitting: *Deleted

## 2016-04-28 ENCOUNTER — Ambulatory Visit (INDEPENDENT_AMBULATORY_CARE_PROVIDER_SITE_OTHER): Payer: BLUE CROSS/BLUE SHIELD | Admitting: General Surgery

## 2016-04-28 VITALS — BP 142/78 | HR 76 | Resp 12 | Ht 63.0 in | Wt 146.0 lb

## 2016-04-28 DIAGNOSIS — K5732 Diverticulitis of large intestine without perforation or abscess without bleeding: Secondary | ICD-10-CM | POA: Insufficient documentation

## 2016-04-28 MED ORDER — IOPAMIDOL (ISOVUE-300) INJECTION 61%
100.0000 mL | Freq: Once | INTRAVENOUS | Status: AC | PRN
  Administered 2016-04-28: 100 mL via INTRAVENOUS

## 2016-04-28 MED ORDER — METRONIDAZOLE 500 MG PO TABS: 500.0000 mg | ORAL_TABLET | Freq: Two times a day (BID) | ORAL | Status: AC

## 2016-04-28 MED ORDER — CIPROFLOXACIN HCL 500 MG PO TABS: 500.0000 mg | ORAL_TABLET | Freq: Two times a day (BID) | ORAL | Status: AC

## 2016-04-28 NOTE — Progress Notes (Signed)
Patient ID: Christine Arias, female   DOB: Mar 09, 1956, 60 y.o.   MRN: [id number]  Chief Complaint  Patient presents with  . Abdominal Pain    HPI Christine Arias is a 60 y.o. female .She called to say the she has started having left lower abdominal pain last night, doubling her over. She is on the way home from the beach. Denies nausea, vomiting, diarrhea, fever or chills. She tends to be more constipated and uses miralax. She states she hurts worse with a BM. She feels it is like the time she had diverticulitis.  I have reviewed the history of present illness with the patient.   HPI  Past Medical History  Diagnosis Date  . Allergy     seasonal  . Diffuse cystic mastopathy   . History of diverticulitis of colon 2003  . Special screening for malignant neoplasms, colon   . Personal history of other malignant neoplasm of skin 2014  . Melanoma ([place]) 2013    excised from left hip region-035mm deep, sn positive. Subsequently had left inguinal node dissection and interferon therapy    Past Surgical History  Procedure Laterality Date  . Melanoma excision Left 2013    excised from hip region-0.29mm deep, sentinel node positive. Subsequently had left inquinal node dissection and interferon therapy  . Inguinal lymph node biopsy Left 2013    left inguinal node dissection,positve sn d/t melanoma left hip region.  . Colonoscopy  2008, 2015    Dr. Jamal Collin  . Tubal ligation    . Tonsillectomy and adenoidectomy    . Cesarean section    . Appendectomy    . Laparoscopy  2009  . Colon surgery  2009    colectomy-sigmoid, hand assist  . Breast cyst aspiration Bilateral 2001  . Breast biopsy Left     neg core 2014    Family History  Problem Relation Age of Onset  . Cancer Father     cancer of throat, cancer of lung  . Breast cancer Maternal Aunt 19  . Breast cancer Maternal Aunt 50  . Colon polyps Mother     Social History Social History  Substance Use Topics  . Smoking status: Never  Smoker   . Smokeless tobacco: Never Used  . Alcohol Use: 0.0 oz/week    0 Standard drinks or equivalent per week     Comment: ocassionally    No Known Allergies  Current Outpatient Prescriptions  Medication Sig Dispense Refill  . Biotin 1000 MCG tablet Take 1,000 mcg by mouth daily.    . calcium carbonate (OS-CAL) 600 MG TABS tablet Take 600 mg by mouth daily with breakfast.    . ciprofloxacin (CIPRO) 500 MG tablet Take 1 tablet (500 mg total) by mouth 2 (two) times daily. 20 tablet 0  . esomeprazole (NEXIUM) 40 MG capsule Take 40 mg by mouth daily at 12 noon.    . fexofenadine (ALLEGRA ALLERGY) 180 MG tablet Take 180 mg by mouth daily.    Marland Kitchen ibandronate (BONIVA) 150 MG tablet Take 150 mg by mouth every 30 (thirty) days.     . metroNIDAZOLE (FLAGYL) 500 MG tablet Take 1 tablet (500 mg total) by mouth 2 (two) times daily. 20 tablet 0  . Misc Natural Products (GLUCOSAMINE CHONDROITIN ADV PO) Take by mouth daily.    . Omega-3 Fatty Acids (FISH OIL EXTRA STRENGTH) 435 MG CAPS Take 1,400 mg by mouth daily.     No current facility-administered medications for this visit.  Review of Systems Review of Systems  Constitutional: Negative.   Respiratory: Negative.   Cardiovascular: Negative.   Gastrointestinal: Positive for abdominal pain.    Blood pressure 142/78, pulse 76, resp. rate 12, height 5\' 3"  (1.6 m), weight 146 lb (66.225 kg).  Physical Exam Physical Exam  Constitutional: She is oriented to person, place, and time. She appears well-developed and well-nourished.  Eyes: Conjunctivae are normal. No scleral icterus.  Neck: Neck supple.  Cardiovascular: Normal rate, regular rhythm and normal heart sounds.   Pulmonary/Chest: Effort normal and breath sounds normal.  Abdominal: Soft. Bowel sounds are normal. There is tenderness in the left lower quadrant.    Lymphadenopathy:    She has no cervical adenopathy.  Neurological: She is alert and oriented to person, place, and time.    Skin: Skin is warm.    Data Reviewed None   Assessment   Probable  Diverticulitis.      Plan     Cipro/flagyl for 10 days. CT abd/pelvis. CBC today.  Patient has been scheduled for a CT abdomen/pelvis with contrast at [facility]. Patient verbalizes understanding.       PCP:  Benita Stabile  This information has been scribed by Gaspar Cola CMA.    Janissa Bertram G 04/28/2016, 3:24 PM

## 2016-04-28 NOTE — Telephone Encounter (Signed)
She called to say the she has started having left lower abdominal pain last night, doubling her over. She is on the way home from the beach. Denies nausea, vomiting, diarrhea, fever or chills. She tends to be more constipated and uses miralax. She states she hurts worse with a BM. She feels it is like the time she had diverticulitis.

## 2016-04-29 LAB — CBC WITH DIFFERENTIAL/PLATELET
BASOS: 0 %
Basophils Absolute: 0 10*3/uL (ref 0.0–0.2)
EOS (ABSOLUTE): 0.1 10*3/uL (ref 0.0–0.4)
EOS: 1 %
HEMATOCRIT: 34.6 % (ref 34.0–46.6)
Hemoglobin: 12 g/dL (ref 11.1–15.9)
IMMATURE GRANULOCYTES: 0 %
Immature Grans (Abs): 0 10*3/uL (ref 0.0–0.1)
LYMPHS ABS: 2.1 10*3/uL (ref 0.7–3.1)
Lymphs: 21 %
MCH: 31.3 pg (ref 26.6–33.0)
MCHC: 34.7 g/dL (ref 31.5–35.7)
MCV: 90 fL (ref 79–97)
MONOS ABS: 0.9 10*3/uL (ref 0.1–0.9)
Monocytes: 9 %
NEUTROS ABS: 7 10*3/uL (ref 1.4–7.0)
Neutrophils: 69 %
Platelets: 249 10*3/uL (ref 150–379)
RBC: 3.83 x10E6/uL (ref 3.77–5.28)
RDW: 13.4 % (ref 12.3–15.4)
WBC: 10.1 10*3/uL (ref 3.4–10.8)

## 2016-05-19 ENCOUNTER — Ambulatory Visit (INDEPENDENT_AMBULATORY_CARE_PROVIDER_SITE_OTHER): Payer: BLUE CROSS/BLUE SHIELD | Admitting: General Surgery

## 2016-05-19 ENCOUNTER — Encounter: Payer: Self-pay | Admitting: General Surgery

## 2016-05-19 VITALS — BP 126/78 | HR 82 | Resp 14 | Ht 63.0 in | Wt 147.0 lb

## 2016-05-19 DIAGNOSIS — K5732 Diverticulitis of large intestine without perforation or abscess without bleeding: Secondary | ICD-10-CM | POA: Diagnosis not present

## 2016-05-19 NOTE — Progress Notes (Signed)
Patient ID: Christine Arias, female   DOB: 1956/08/29, 60 y.o.   MRN: [id number]  Chief Complaint  Patient presents with  . Follow-up    diverticilitis     HPI Christine Arias is a 60 y.o. female following up here for diverticulitis. She has completed her antibiotics. Her abdominal pain is markedly improved She states that she had some diarrhea with the antibiotics. She is still having loose stools, following meals, but denies pain. Also reports bloating and gas.  I have reviewed the history of present illness with the patient.   HPI  Past Medical History  Diagnosis Date  . Allergy     seasonal  . Diffuse cystic mastopathy   . History of diverticulitis of colon 2003  . Special screening for malignant neoplasms, colon   . Personal history of other malignant neoplasm of skin 2014  . Melanoma ([place]) 2013    excised from left hip region-055mm deep, sn positive. Subsequently had left inguinal node dissection and interferon therapy    Past Surgical History  Procedure Laterality Date  . Melanoma excision Left 2013    excised from hip region-0.26mm deep, sentinel node positive. Subsequently had left inquinal node dissection and interferon therapy  . Inguinal lymph node biopsy Left 2013    left inguinal node dissection,positve sn d/t melanoma left hip region.  . Colonoscopy  2008, 2015    Dr. Jamal Collin  . Tubal ligation    . Tonsillectomy and adenoidectomy    . Cesarean section    . Appendectomy    . Laparoscopy  2009  . Colon surgery  2009    colectomy-sigmoid, hand assist  . Breast cyst aspiration Bilateral 2001  . Breast biopsy Left     neg core 2014    Family History  Problem Relation Age of Onset  . Cancer Father     cancer of throat, cancer of lung  . Breast cancer Maternal Aunt 38  . Breast cancer Maternal Aunt 3  . Colon polyps Mother     Social History Social History  Substance Use Topics  . Smoking status: Never Smoker   . Smokeless tobacco: Never Used  . Alcohol  Use: 0.0 oz/week    0 Standard drinks or equivalent per week     Comment: ocassionally    No Known Allergies  Current Outpatient Prescriptions  Medication Sig Dispense Refill  . Biotin 1000 MCG tablet Take 1,000 mcg by mouth daily.    . calcium carbonate (OS-CAL) 600 MG TABS tablet Take 600 mg by mouth daily with breakfast.    . esomeprazole (NEXIUM) 40 MG capsule Take 40 mg by mouth daily at 12 noon.    . fexofenadine (ALLEGRA ALLERGY) 180 MG tablet Take 180 mg by mouth daily.    Marland Kitchen ibandronate (BONIVA) 150 MG tablet Take 150 mg by mouth every 30 (thirty) days.     . Misc Natural Products (GLUCOSAMINE CHONDROITIN ADV PO) Take by mouth daily.    . Omega-3 Fatty Acids (FISH OIL EXTRA STRENGTH) 435 MG CAPS Take 1,400 mg by mouth daily.     No current facility-administered medications for this visit.    Review of Systems Review of Systems  Constitutional: Negative.   Respiratory: Negative.   Cardiovascular: Negative.   Gastrointestinal: Negative.     Blood pressure 126/78, pulse 82, resp. rate 14, height 5\' 3"  (1.6 m), weight 147 lb (66.679 kg).  Physical Exam Physical Exam  Constitutional: She appears well-developed and well-nourished.  HENT:  Mouth/Throat: No oropharyngeal exudate.  Eyes: No scleral icterus.  Cardiovascular: Normal rate and regular rhythm.   Abdominal: Soft. Bowel sounds are normal. She exhibits no distension. There is tenderness (mild LLQ ).  The tenderness is markedly diminished from 2 weeks ago  Data Reviewed Prior notes CT scan  Assessment    Diverticulitis-clinicallyresolved -- CT scan showed inflammation, no free air/abscess Diarrhea     Plan    Recommend probiotics to help  with diarrhea.  If continues or gets worse, call office for evaluation.  Follow-up as scheduled    PCP:  Benita Stabile  This has been scribed by Lesly Rubenstein LPN    [facility] G 05/24/2016, 8:11 AM

## 2016-05-19 NOTE — Patient Instructions (Addendum)
Recommend increase fiber intake for helping with diarrhea.  If continues or gets worse, call office for evaluation.  Follow-up as scheduled.

## 2016-05-24 ENCOUNTER — Encounter: Payer: Self-pay | Admitting: General Surgery

## 2016-05-31 ENCOUNTER — Ambulatory Visit (INDEPENDENT_AMBULATORY_CARE_PROVIDER_SITE_OTHER): Payer: BLUE CROSS/BLUE SHIELD | Admitting: General Surgery

## 2016-05-31 ENCOUNTER — Telehealth: Payer: Self-pay

## 2016-05-31 ENCOUNTER — Encounter: Payer: Self-pay | Admitting: General Surgery

## 2016-05-31 VITALS — BP 122/70 | HR 78 | Resp 12 | Ht 63.0 in | Wt 146.0 lb

## 2016-05-31 DIAGNOSIS — R1011 Right upper quadrant pain: Secondary | ICD-10-CM | POA: Diagnosis not present

## 2016-05-31 NOTE — Telephone Encounter (Signed)
Patient called and had some concerns about bloating and discomfort. She was seen last on 05/19/16 for follow up diverticulitis. She states that starting Friday 05/27/16 she had bloating across her whole stomach. This lasted the whole weekend. Then she started having some discomfort in her lower left side under her ribs last night. She states that today the discomfort is in her left back, but it feels deep. She reports no nausea, vomiting, chills, or fevers. She states the diarrhea is still off and on. She is going about 3-4 times a day after she eats. Just wanted to know what she may need to do if anything.

## 2016-05-31 NOTE — Telephone Encounter (Signed)
error 

## 2016-05-31 NOTE — Progress Notes (Signed)
Patient ID: Christine Arias, female   DOB: 05/18/1956, 60 y.o.   MRN: [id number]  Chief Complaint  Patient presents with  . Abdominal Pain    HPI Christine Arias is a 60 y.o. female here for a follow up from diverticulitis. She has completed her last course of antibiotics. She states that starting Friday 05/27/16 she had bloating across her whole stomach. This lasted the whole weekend. Then she started having some discomfort in her lower left side under her ribs last night. She states that today the discomfort is in her left back, but it feels deep. She reports no nausea, vomiting, chills, or fevers. She states the diarrhea is still off and on. She is going about 3-4 times a day after she eats. HPI  Past Medical History  Diagnosis Date  . Allergy     seasonal  . Diffuse cystic mastopathy   . History of diverticulitis of colon 2003  . Special screening for malignant neoplasms, colon   . Personal history of other malignant neoplasm of skin 2014  . Melanoma ([place]) 2013    excised from left hip region-034mm deep, sn positive. Subsequently had left inguinal node dissection and interferon therapy    Past Surgical History  Procedure Laterality Date  . Melanoma excision Left 2013    excised from hip region-0.6mm deep, sentinel node positive. Subsequently had left inquinal node dissection and interferon therapy  . Inguinal lymph node biopsy Left 2013    left inguinal node dissection,positve sn d/t melanoma left hip region.  . Colonoscopy  2008, 2015    Dr. Jamal Collin  . Tubal ligation    . Tonsillectomy and adenoidectomy    . Cesarean section    . Appendectomy    . Laparoscopy  2009  . Colon surgery  2009    colectomy-sigmoid, hand assist  . Breast cyst aspiration Bilateral 2001  . Breast biopsy Left     neg core 2014    Family History  Problem Relation Age of Onset  . Cancer Father     cancer of throat, cancer of lung  . Breast cancer Maternal Aunt 33  . Breast cancer Maternal Aunt 15   . Colon polyps Mother     Social History Social History  Substance Use Topics  . Smoking status: Never Smoker   . Smokeless tobacco: Never Used  . Alcohol Use: 0.0 oz/week    0 Standard drinks or equivalent per week     Comment: ocassionally    No Known Allergies  Current Outpatient Prescriptions  Medication Sig Dispense Refill  . Biotin 1000 MCG tablet Take 1,000 mcg by mouth daily.    . calcium carbonate (OS-CAL) 600 MG TABS tablet Take 600 mg by mouth daily with breakfast.    . esomeprazole (NEXIUM) 40 MG capsule Take 40 mg by mouth daily at 12 noon.    . fexofenadine (ALLEGRA ALLERGY) 180 MG tablet Take 180 mg by mouth daily.    Marland Kitchen ibandronate (BONIVA) 150 MG tablet Take 150 mg by mouth every 30 (thirty) days.     . Misc Natural Products (GLUCOSAMINE CHONDROITIN ADV PO) Take by mouth daily.    . Omega-3 Fatty Acids (FISH OIL EXTRA STRENGTH) 435 MG CAPS Take 1,400 mg by mouth daily.     No current facility-administered medications for this visit.    Review of Systems Review of Systems  Constitutional: Negative.   Respiratory: Negative.   Cardiovascular: Negative.   Gastrointestinal: Positive for abdominal pain, diarrhea and  abdominal distention. Negative for nausea, vomiting, constipation, blood in stool, anal bleeding and rectal pain.    Blood pressure 122/70, pulse 78, resp. rate 12, height 5\' 3"  (1.6 m), weight 146 lb (66.225 kg).  Physical Exam Physical Exam  Constitutional: She appears well-developed and well-nourished.  Eyes: Conjunctivae are normal.  Abdominal: Soft. Normal appearance and bowel sounds are normal. There is tenderness. There is no rebound and no guarding.      Data Reviewed Prior notes  Assessment    Ill defined symptoms in abdomen, not typical for diverticulitis.  Exam does show mild tenderness in left lower/lateral abdomen.      Plan    CBC, U/A. Rx with Augmentin     PCP: Benita Stabile This has been scribed by Lesly Rubenstein LPN    [facility] G 06/03/2016, 8:41 AM

## 2016-05-31 NOTE — Telephone Encounter (Signed)
I talked with pt by phone. Need to check her. Asked her to come this afternoon.-around 4

## 2016-06-01 LAB — URINALYSIS, COMPLETE
Bilirubin, UA: NEGATIVE
GLUCOSE, UA: NEGATIVE
Ketones, UA: NEGATIVE
Leukocytes, UA: NEGATIVE
NITRITE UA: NEGATIVE
Protein, UA: NEGATIVE
RBC UA: NEGATIVE
SPEC GRAV UA: 1.013 (ref 1.005–1.030)
Urobilinogen, Ur: 0.2 mg/dL (ref 0.2–1.0)
pH, UA: 6 (ref 5.0–7.5)

## 2016-06-01 LAB — MICROSCOPIC EXAMINATION
Casts: NONE SEEN /lpf
RBC MICROSCOPIC, UA: NONE SEEN /HPF (ref 0–?)
WBC UA: NONE SEEN /HPF (ref 0–?)

## 2016-06-01 LAB — CBC WITH DIFFERENTIAL/PLATELET
Basophils Absolute: 0 10*3/uL (ref 0.0–0.2)
Basos: 0 %
EOS (ABSOLUTE): 0.1 10*3/uL (ref 0.0–0.4)
EOS: 1 %
HEMATOCRIT: 36.4 % (ref 34.0–46.6)
Hemoglobin: 12 g/dL (ref 11.1–15.9)
IMMATURE GRANULOCYTES: 1 %
Immature Grans (Abs): 0 10*3/uL (ref 0.0–0.1)
Lymphocytes Absolute: 1.9 10*3/uL (ref 0.7–3.1)
Lymphs: 33 %
MCH: 30 pg (ref 26.6–33.0)
MCHC: 33 g/dL (ref 31.5–35.7)
MCV: 91 fL (ref 79–97)
MONOS ABS: 0.5 10*3/uL (ref 0.1–0.9)
Monocytes: 8 %
Neutrophils Absolute: 3.3 10*3/uL (ref 1.4–7.0)
Neutrophils: 57 %
PLATELETS: 252 10*3/uL (ref 150–379)
RBC: 4 x10E6/uL (ref 3.77–5.28)
RDW: 13.3 % (ref 12.3–15.4)
WBC: 5.7 10*3/uL (ref 3.4–10.8)

## 2016-06-02 ENCOUNTER — Ambulatory Visit: Payer: BLUE CROSS/BLUE SHIELD | Admitting: General Surgery

## 2016-06-03 ENCOUNTER — Encounter: Payer: Self-pay | Admitting: General Surgery

## 2016-06-06 ENCOUNTER — Telehealth: Payer: Self-pay | Admitting: *Deleted

## 2016-06-06 NOTE — Telephone Encounter (Signed)
Patient called and wants to know her lab results from 05/31/16

## 2016-09-29 ENCOUNTER — Other Ambulatory Visit: Payer: Self-pay

## 2016-09-29 DIAGNOSIS — Z1231 Encounter for screening mammogram for malignant neoplasm of breast: Secondary | ICD-10-CM

## 2016-12-15 ENCOUNTER — Ambulatory Visit
Admission: RE | Admit: 2016-12-15 | Discharge: 2016-12-15 | Disposition: A | Discharge status: Home / Self Care | Payer: BLUE CROSS/BLUE SHIELD | Source: Ambulatory Visit | Attending: General Surgery | Admitting: General Surgery

## 2016-12-15 DIAGNOSIS — Z1231 Encounter for screening mammogram for malignant neoplasm of breast: Secondary | ICD-10-CM | POA: Insufficient documentation

## 2016-12-21 ENCOUNTER — Ambulatory Visit (INDEPENDENT_AMBULATORY_CARE_PROVIDER_SITE_OTHER): Payer: BLUE CROSS/BLUE SHIELD | Admitting: General Surgery

## 2016-12-21 ENCOUNTER — Encounter: Payer: Self-pay | Admitting: General Surgery

## 2016-12-21 VITALS — BP 130/60 | HR 86 | Resp 12 | Ht 63.0 in | Wt 148.0 lb

## 2016-12-21 DIAGNOSIS — N6019 Diffuse cystic mastopathy of unspecified breast: Secondary | ICD-10-CM

## 2016-12-21 DIAGNOSIS — Z85820 Personal history of malignant melanoma of skin: Secondary | ICD-10-CM | POA: Diagnosis not present

## 2016-12-21 DIAGNOSIS — Z8371 Family history of colonic polyps: Secondary | ICD-10-CM | POA: Diagnosis not present

## 2016-12-21 NOTE — Progress Notes (Signed)
Patient ID: Christine Arias, female   DOB: 1956/06/30, 61 y.o.   MRN: [id number]  Chief Complaint  Patient presents with  . Follow-up    mammogram    HPI Christine Arias is a 61 y.o. female who presents for a breast evaluation. The most recent mammogram was done on 12/15/2016. Patient had a melanoma and left inguinal node excised in 2013, states she does not have a CT scheduled for another 2 years. Pt wears compression hose on the left leg for lymphedema. She is also s/p sigmoid resection for diverticulitis Patient does perform regular self breast checks and gets regular mammograms done.   I have reviewed the history of present illness with the patient.   HPI  Past Medical History:  Diagnosis Date  . Allergy    seasonal  . Diffuse cystic mastopathy   . History of diverticulitis of colon 2003  . Melanoma ([place]) 2013   excised from left hip region-090mm deep, sn positive. Subsequently had left inguinal node dissection and interferon therapy  . Personal history of other malignant neoplasm of skin 2014  . Special screening for malignant neoplasms, colon     Past Surgical History:  Procedure Laterality Date  . APPENDECTOMY    . BREAST BIOPSY Left    neg core 2014  . BREAST CYST ASPIRATION Bilateral 2001  . CESAREAN SECTION    . COLON SURGERY  2009   colectomy-sigmoid, hand assist  . COLONOSCOPY  2008, 2015   Dr. Jamal Collin  . INGUINAL LYMPH NODE BIOPSY Left 2013   left inguinal node dissection,positve sn d/t melanoma left hip region.  Marland Kitchen LAPAROSCOPY  2009  . MELANOMA EXCISION Left 2013   excised from hip region-0.34mm deep, sentinel node positive. Subsequently had left inquinal node dissection and interferon therapy  . TONSILLECTOMY AND ADENOIDECTOMY    . TUBAL LIGATION      Family History  Problem Relation Age of Onset  . Cancer Father     cancer of throat, cancer of lung  . Colon polyps Mother   . Breast cancer Maternal Aunt 54  . Breast cancer Maternal Aunt 38  . Breast cancer  Maternal Aunt 60    Social History Social History  Substance Use Topics  . Smoking status: Never Smoker  . Smokeless tobacco: Never Used  . Alcohol use 0.0 oz/week     Comment: ocassionally    No Known Allergies  Current Outpatient Prescriptions  Medication Sig Dispense Refill  . Biotin 1000 MCG tablet Take 1,000 mcg by mouth daily.    . calcium carbonate (OS-CAL) 600 MG TABS tablet Take 600 mg by mouth daily with breakfast.    . esomeprazole (NEXIUM) 40 MG capsule Take 40 mg by mouth daily at 12 noon.    . fexofenadine (ALLEGRA ALLERGY) 180 MG tablet Take 180 mg by mouth daily.    Marland Kitchen ibandronate (BONIVA) 150 MG tablet Take 150 mg by mouth every 30 (thirty) days.     . Misc Natural Products (GLUCOSAMINE CHONDROITIN ADV PO) Take by mouth daily.     No current facility-administered medications for this visit.     Review of Systems Review of Systems  Constitutional: Negative.   Respiratory: Negative.   Cardiovascular: Negative.     Blood pressure 130/60, pulse 86, resp. rate 12, height 5\' 3"  (1.6 m), weight 148 lb (67.1 kg).  Physical Exam Physical Exam  Constitutional: She is oriented to person, place, and time. She appears well-developed and well-nourished.  HENT:  Mouth/Throat:  Oropharynx is clear and moist.  Eyes: Conjunctivae are normal. No scleral icterus.  Neck: Neck supple.  Cardiovascular: Normal rate, regular rhythm and normal heart sounds.   Lymphedema in the left leg.  Pulmonary/Chest: Effort normal and breath sounds normal. Right breast exhibits no inverted nipple, no mass, no nipple discharge, no skin change and no tenderness. Left breast exhibits no inverted nipple, no mass, no nipple discharge, no skin change and no tenderness.  Abdominal: Soft. Bowel sounds are normal. There is no tenderness.  Lymphadenopathy:    She has no cervical adenopathy.    She has no axillary adenopathy.  Neurological: She is alert and oriented to person, place, and time.  Skin:  Skin is warm and dry.  Psychiatric: Her behavior is normal.    Data Reviewed Mammogram reviewed   Assessment    Stable exam.  FCD.  Family history of colonic polyps. Personal history of melanoma.    Plan    Follow-up in one year with bilateral screening mammogram and breast exam Dr. Bary Castilla. Colonoscopy will be due in 2020.    This information has been scribed by Gaspar Cola CMA.    SANKAR,SEEPLAPUTHUR G 12/23/2016, 8:44 AM

## 2016-12-21 NOTE — Patient Instructions (Addendum)
Call back with any questions/concerns. Last colonoscopy was done in 2015. Next one will be due in 2020. Follow-up in one year with bilateral screening mammogram and breast exam Dr. Bary Castilla.

## 2017-06-13 ENCOUNTER — Ambulatory Visit
Admission: RE | Admit: 2017-06-13 | Discharge: 2017-06-13 | Disposition: A | Discharge status: Home / Self Care | Payer: BLUE CROSS/BLUE SHIELD | Source: Ambulatory Visit | Attending: Internal Medicine | Admitting: Internal Medicine

## 2017-06-13 ENCOUNTER — Other Ambulatory Visit: Payer: Self-pay | Admitting: Internal Medicine

## 2017-06-13 DIAGNOSIS — R05 Cough: Secondary | ICD-10-CM | POA: Diagnosis not present

## 2017-06-13 DIAGNOSIS — R059 Cough, unspecified: Secondary | ICD-10-CM

## 2017-10-10 ENCOUNTER — Encounter (INDEPENDENT_AMBULATORY_CARE_PROVIDER_SITE_OTHER): Payer: BLUE CROSS/BLUE SHIELD | Admitting: Ophthalmology

## 2017-10-23 ENCOUNTER — Encounter (INDEPENDENT_AMBULATORY_CARE_PROVIDER_SITE_OTHER): Payer: BLUE CROSS/BLUE SHIELD | Admitting: Ophthalmology

## 2017-10-26 ENCOUNTER — Other Ambulatory Visit: Payer: Self-pay

## 2017-10-26 DIAGNOSIS — Z1231 Encounter for screening mammogram for malignant neoplasm of breast: Secondary | ICD-10-CM

## 2017-10-27 ENCOUNTER — Encounter (INDEPENDENT_AMBULATORY_CARE_PROVIDER_SITE_OTHER): Payer: BLUE CROSS/BLUE SHIELD | Admitting: Ophthalmology

## 2017-10-27 DIAGNOSIS — H43813 Vitreous degeneration, bilateral: Secondary | ICD-10-CM

## 2017-10-27 DIAGNOSIS — H2513 Age-related nuclear cataract, bilateral: Secondary | ICD-10-CM

## 2017-10-27 DIAGNOSIS — H33301 Unspecified retinal break, right eye: Secondary | ICD-10-CM | POA: Diagnosis not present

## 2017-10-27 DIAGNOSIS — H5213 Myopia, bilateral: Secondary | ICD-10-CM | POA: Diagnosis not present

## 2017-11-24 ENCOUNTER — Encounter (INDEPENDENT_AMBULATORY_CARE_PROVIDER_SITE_OTHER): Payer: BLUE CROSS/BLUE SHIELD | Admitting: Ophthalmology

## 2017-11-24 DIAGNOSIS — H33301 Unspecified retinal break, right eye: Secondary | ICD-10-CM

## 2017-12-19 ENCOUNTER — Ambulatory Visit
Admission: RE | Admit: 2017-12-19 | Discharge: 2017-12-19 | Disposition: A | Discharge status: Home / Self Care | Payer: BLUE CROSS/BLUE SHIELD | Source: Ambulatory Visit | Attending: General Surgery | Admitting: General Surgery

## 2017-12-19 DIAGNOSIS — Z1231 Encounter for screening mammogram for malignant neoplasm of breast: Secondary | ICD-10-CM | POA: Diagnosis not present

## 2017-12-27 ENCOUNTER — Ambulatory Visit: Payer: BLUE CROSS/BLUE SHIELD | Admitting: General Surgery

## 2018-01-02 ENCOUNTER — Encounter: Payer: Self-pay | Admitting: General Surgery

## 2018-01-02 ENCOUNTER — Ambulatory Visit (INDEPENDENT_AMBULATORY_CARE_PROVIDER_SITE_OTHER): Payer: BLUE CROSS/BLUE SHIELD | Admitting: General Surgery

## 2018-01-02 VITALS — BP 134/72 | HR 66 | Resp 12 | Ht 64.0 in | Wt 152.0 lb

## 2018-01-02 DIAGNOSIS — N6019 Diffuse cystic mastopathy of unspecified breast: Secondary | ICD-10-CM | POA: Diagnosis not present

## 2018-01-02 NOTE — Progress Notes (Signed)
Patient ID: Christine Arias, female   DOB: Apr 04, 1956, 62 y.o.   MRN: [id number]  Chief Complaint  Patient presents with  . Follow-up    Christine Arias is a 62 y.o. female who presents for a breast evaluation. The most recent mammogram was done on 12/19/2017.  Patient does perform regular self breast checks and gets regular mammograms done.    Christine  Past Medical History:  Diagnosis Date  . Allergy    seasonal  . Diffuse cystic mastopathy   . History of diverticulitis of colon 2003  . Melanoma ([place]) 2013   excised from left hip region-032mm deep, sn positive. Subsequently had left inguinal node dissection and interferon therapy  . Personal history of other malignant neoplasm of skin 2014  . Special screening for malignant neoplasms, colon     Past Surgical History:  Procedure Laterality Date  . APPENDECTOMY    . BREAST BIOPSY Left    neg core 2014  . BREAST CYST ASPIRATION Bilateral 2001  . CESAREAN SECTION    . COLON SURGERY  2009   colectomy-sigmoid, hand assist  . COLONOSCOPY  2008, 2015   Dr. Jamal Collin  . INGUINAL LYMPH NODE BIOPSY Left 2013   left inguinal node dissection,positve sn d/t melanoma left hip region.  Marland Kitchen LAPAROSCOPY  2009  . MELANOMA EXCISION Left 2013   excised from hip region-0.79mm deep, sentinel node positive. Subsequently had left inquinal node dissection and interferon therapy  . TONSILLECTOMY AND ADENOIDECTOMY    . TUBAL LIGATION      Family History  Problem Relation Age of Onset  . Cancer Father        cancer of throat, cancer of lung  . Colon polyps Mother   . Breast cancer Maternal Aunt 39  . Breast cancer Maternal Aunt 106  . Breast cancer Maternal Aunt 60    Social History Social History   Tobacco Use  . Smoking status: Never Smoker  . Smokeless tobacco: Never Used  Substance Use Topics  . Alcohol use: Yes    Alcohol/week: 0.0 oz    Comment: ocassionally  . Drug use: No    No Known Allergies  Current Outpatient Medications   Medication Sig Dispense Refill  . Biotin 1000 MCG tablet Take 1,000 mcg by mouth daily.    . calcium carbonate (OS-CAL) 600 MG TABS tablet Take 600 mg by mouth daily with breakfast.    . esomeprazole (NEXIUM) 40 MG capsule Take 40 mg by mouth daily at 12 noon.    . fexofenadine (ALLEGRA ALLERGY) 180 MG tablet Take 180 mg by mouth daily.    Marland Kitchen ibandronate (BONIVA) 150 MG tablet Take 150 mg by mouth every 30 (thirty) days.     . Misc Natural Products (GLUCOSAMINE CHONDROITIN ADV PO) Take by mouth daily.     No current facility-administered medications for this visit.     Review of Systems Review of Systems  Blood pressure 134/72, pulse 66, resp. rate 12, height 5\' 4"  (1.626 m), weight 152 lb (68.9 kg).  Physical Exam Physical Exam  Constitutional: She is oriented to person, place, and time. She appears well-developed and well-nourished.  Eyes: Conjunctivae are normal. No scleral icterus.  Neck: Neck supple.  Cardiovascular: Normal rate, regular rhythm and normal heart sounds.  Pulmonary/Chest: Effort normal and breath sounds normal. Right breast exhibits no inverted nipple, no mass, no nipple discharge, no skin change and no tenderness. Left breast exhibits no inverted nipple, no mass, no nipple discharge,  no skin change and no tenderness.  Lymphadenopathy:    She has no cervical adenopathy.    She has no axillary adenopathy.  Neurological: She is alert and oriented to person, place, and time.  Skin: Skin is warm and dry.    Data Reviewed Bilateral screening mammograms dated December 19, 2017 were reviewed.  No interval change.  BI-RADS-1.  Assessment    Stable breast exam.    Plan  Patient will be asked to return to the office in one year with a bilateral screening mammogram. The patient is aware to call back for any questions or concerns.  Christine, Physical Exam, Assessment and Plan have been scribed under the direction and in the presence of Hervey Ard, MD.  Gaspar Cola, CMA  I have completed the exam and reviewed the above documentation for accuracy and completeness.  I agree with the above.  Haematologist has been used and any errors in dictation or transcription are unintentional.  Hervey Ard, M.D., F.A.C.S.   [facility]st Gleason Song Garris 01/02/2018, 8:09 PM

## 2018-01-02 NOTE — Patient Instructions (Signed)
Patient will be asked to return to the office in one year with a bilateral screening mammogram. The patient is aware to call back for any questions or concerns. 

## 2018-03-26 ENCOUNTER — Encounter (INDEPENDENT_AMBULATORY_CARE_PROVIDER_SITE_OTHER): Payer: BLUE CROSS/BLUE SHIELD | Admitting: Ophthalmology

## 2018-03-26 DIAGNOSIS — H2513 Age-related nuclear cataract, bilateral: Secondary | ICD-10-CM

## 2018-03-26 DIAGNOSIS — H43813 Vitreous degeneration, bilateral: Secondary | ICD-10-CM | POA: Diagnosis not present

## 2018-03-26 DIAGNOSIS — H33301 Unspecified retinal break, right eye: Secondary | ICD-10-CM

## 2018-03-26 DIAGNOSIS — H5213 Myopia, bilateral: Secondary | ICD-10-CM | POA: Diagnosis not present

## 2018-10-25 ENCOUNTER — Other Ambulatory Visit: Payer: Self-pay

## 2018-10-25 ENCOUNTER — Ambulatory Visit
Admission: EM | Admit: 2018-10-25 | Discharge: 2018-10-25 | Disposition: A | Discharge status: Home / Self Care | Payer: BLUE CROSS/BLUE SHIELD | Attending: Family Medicine | Admitting: Family Medicine

## 2018-10-25 ENCOUNTER — Encounter: Payer: Self-pay | Admitting: Emergency Medicine

## 2018-10-25 ENCOUNTER — Ambulatory Visit (INDEPENDENT_AMBULATORY_CARE_PROVIDER_SITE_OTHER): Payer: BLUE CROSS/BLUE SHIELD

## 2018-10-25 DIAGNOSIS — W19XXXA Unspecified fall, initial encounter: Secondary | ICD-10-CM | POA: Diagnosis not present

## 2018-10-25 DIAGNOSIS — S62615A Displaced fracture of proximal phalanx of left ring finger, initial encounter for closed fracture: Secondary | ICD-10-CM | POA: Diagnosis not present

## 2018-10-25 DIAGNOSIS — X58XXXA Exposure to other specified factors, initial encounter: Secondary | ICD-10-CM | POA: Diagnosis not present

## 2018-10-25 NOTE — ED Provider Notes (Signed)
[facility]    CSN: [id number] Arrival date & time: 10/25/18  1152  History   Chief Complaint Chief Complaint  Patient presents with  . Hand Pain    left appt   HPI  62 year old female presents with hand injury.  Patient states that last night she fell while carrying a heavy box.  She fell on concrete.  When she fell she injured her left hand.  Her pain is predominantly located at the base of the left ring finger (at the MCP joint).  Associated swelling and bruising.  Her pain is mild to moderate and occurs only when she is moving the finger.  No known relieving factors.  She states that the swelling seems to be worse today.  No other associated symptoms.  No other complaints.  PMH, Surgical Hx, Family Hx, Social History reviewed and updated as below.  Past Medical History:  Diagnosis Date  . Allergy    seasonal  . Diffuse cystic mastopathy   . History of diverticulitis of colon 2003  . Melanoma ([place]) 2013   T1b N1 excised from left hip region-019mm deep, sn positive. Subsequently had left inguinal node dissection and interferon therapy  . Personal history of other malignant neoplasm of skin 2014  . Special screening for malignant neoplasms, colon     Patient Active Problem List   Diagnosis Date Noted  . History of melanoma 12/19/2014  . History of diverticulitis 12/19/2014  . Family history of colonic polyps 12/19/2014  . Breast mass 12/10/2013  . Fibrocystic breast disease 12/10/2013  . History of diverticulitis of colon   . Melanoma [facility])     Past Surgical History:  Procedure Laterality Date  . APPENDECTOMY    . BREAST BIOPSY Left    neg core 2014  . BREAST CYST ASPIRATION Bilateral 2001  . CESAREAN SECTION    . COLON SURGERY  2009   colectomy-sigmoid, hand assist  . COLONOSCOPY  2008, 2015   Dr. Jamal Collin  . INGUINAL LYMPH NODE BIOPSY Left 2013   left inguinal node dissection,positve sn d/t melanoma left hip region.  Marland Kitchen LAPAROSCOPY  2009  .  MELANOMA EXCISION Left 2013   excised from hip region-0.19mm deep, sentinel node positive. Subsequently had left inquinal node dissection and interferon therapy  . TONSILLECTOMY AND ADENOIDECTOMY    . TUBAL LIGATION      OB History    Gravida  1   Para      Term      Preterm      AB      Living  2     SAB      TAB      Ectopic      Multiple      Live Births           Obstetric Comments  Age with first menstruation-13 Age with first pregnancy-24-twins         Home Medications    Prior to Admission medications   Medication Sig Start Date End Date Taking? Authorizing Provider  Biotin 1000 MCG tablet Take 1,000 mcg by mouth daily.   Yes [provider]  calcium carbonate (OS-CAL) 600 MG TABS tablet Take 600 mg by mouth daily with breakfast.   Yes [provider]  escitalopram (LEXAPRO) 10 MG tablet Take 10 mg by mouth daily.   Yes [provider]  esomeprazole (NEXIUM) 40 MG capsule Take 40 mg by mouth daily at 12 noon.   Yes [provider]  fexofenadine (ALLEGRA ALLERGY) 180 MG tablet Take 180 mg by mouth daily.   Yes [provider]  furosemide (LASIX) 20 MG tablet Take by mouth. 09/26/18 09/26/19 Yes [provider]  Misc Natural Products (GLUCOSAMINE CHONDROITIN ADV PO) Take by mouth daily.   Yes [provider]    Family History Family History  Problem Relation Age of Onset  . Cancer Father        cancer of throat, cancer of lung  . Colon polyps Mother   . Breast cancer Maternal Aunt 89  . Breast cancer Maternal Aunt 4  . Breast cancer Maternal Aunt 60    Social History Social History   Tobacco Use  . Smoking status: Never Smoker  . Smokeless tobacco: Never Used  Substance Use Topics  . Alcohol use: Yes    Alcohol/week: 0.0 standard drinks    Comment: ocassionally  . Drug use: No     Allergies   Patient has no known allergies.   Review of Systems Review of Systems    Constitutional: Negative.   Musculoskeletal:       Hand/finger injury.   Physical Exam Triage Vital Signs ED Triage Vitals  Enc Vitals Group     BP 10/25/18 1206 139/83     Pulse Rate 10/25/18 1206 79     Resp 10/25/18 1206 18     Temp 10/25/18 1206 98.1 F (36.7 C)     Temp Source 10/25/18 1206 Oral     SpO2 10/25/18 1206 100 %     Weight 10/25/18 1206 152 lb (68.9 kg)     Height 10/25/18 1206 5' 2.75" (1.594 m)     Head Circumference --      Peak Flow --      Pain Score 10/25/18 1205 1     Pain Loc --      Pain Edu? --      Excl. in [place]? --    Updated Vital Signs BP 139/83 (BP Location: Left Arm)   Pulse 79   Temp 98.1 F (36.7 C) (Oral)   Resp 18   Ht 5' 2.75" (1.594 m)   Wt 68.9 kg   SpO2 100%   BMI 27.14 kg/m   Visual Acuity Right Eye Distance:   Left Eye Distance:   Bilateral Distance:    Right Eye Near:   Left Eye Near:    Bilateral Near:     Physical Exam Vitals signs and nursing note reviewed.  Constitutional:      Appearance: Normal appearance. She is not ill-appearing.  HENT:     Head: Normocephalic and atraumatic.     Nose: Nose normal.  Eyes:     General:        Right eye: No discharge.        Left eye: No discharge.     Conjunctiva/sclera: Conjunctivae normal.  Pulmonary:     Effort: Pulmonary effort is normal.     Breath sounds: No rhonchi or rales.  Musculoskeletal:     Comments: Left hand -patient with bruising of the left ring finger.  Exquisitely tender to palpation at the MCP joint of the left ring finger.  Mild tenderness at the PIP joint.  Neurological:     Mental Status: She is alert and oriented to person, place, and time.    UC Treatments / Results  Labs (all labs ordered are listed, but only abnormal results are displayed) Labs Reviewed - No data to display  EKG  None  Radiology Dg Hand Complete Left  Result Date: 10/25/2018 CLINICAL DATA:  [place] down steps last night carrying a box, injuring LEFT hand, pain at  ring finger MCP joint and PIP joint, some soft tissue swelling middle finger as well EXAM: LEFT HAND - COMPLETE 3+ VIEW COMPARISON:  None FINDINGS: Osseous demineralization. Joint spaces preserved. Oblique mildly displaced and minimally angulated fracture at base of proximal phalanx LEFT ring finger without intra-articular extension. No additional fracture, dislocation, or bone destruction. Minimal chondrocalcinosis at wrist question CPPD. IMPRESSION: Displaced and angulated fracture at base of proximal phalanx LEFT ring finger. Electronically Signed   By: Lavonia Dana M.D.   On: 10/25/2018 12:49    Procedures Procedures (including critical care time)  Medications Ordered in UC Medications - No data to display  Initial Impression / Assessment and Plan / UC Course  I have reviewed the triage vital signs and the nursing notes.  Pertinent labs & imaging results that were available during my care of the patient were reviewed by me and considered in my medical decision making (see chart for details).    62 year old female presents with a displaced and angulated fracture of the base of the proximal phalanx of the left ring finger.  Placed in a ulnar splint.  Patient declined pain medication.  Advised to see [facility].  Final Clinical Impressions(s) / UC Diagnoses   Final diagnoses:  Closed displaced fracture of proximal phalanx of left ring finger, initial encounter     Discharge Instructions     Splint.  Tylenol and/or ibuprofen as needed.  See [facility] today or tomorrow.  Take care  Dr. Lacinda Axon    ED Prescriptions    None     Controlled Substance Prescriptions [place] Controlled Substance Registry consulted? Not Applicable   Coral Spikes, DO 10/25/18 1353

## 2018-10-25 NOTE — ED Triage Notes (Signed)
Pt c/o hand pain in her left hand. She fell with a box in her hand on concrete. She has left hand pain, bruising and swelling. She can move her hand but is painful.

## 2018-10-25 NOTE — Discharge Instructions (Signed)
Splint.  Tylenol and/or ibuprofen as needed.  See [facility] today or tomorrow.  Take care  Dr. Lacinda Axon

## 2018-11-13 ENCOUNTER — Other Ambulatory Visit: Payer: Self-pay

## 2018-11-13 DIAGNOSIS — Z1231 Encounter for screening mammogram for malignant neoplasm of breast: Secondary | ICD-10-CM

## 2018-12-20 ENCOUNTER — Ambulatory Visit
Admission: RE | Admit: 2018-12-20 | Discharge: 2018-12-20 | Disposition: A | Discharge status: Home / Self Care | Payer: Medicare Other | Source: Ambulatory Visit | Attending: Surgery | Admitting: Surgery

## 2018-12-20 DIAGNOSIS — Z1231 Encounter for screening mammogram for malignant neoplasm of breast: Secondary | ICD-10-CM | POA: Diagnosis not present

## 2018-12-27 ENCOUNTER — Encounter: Payer: Self-pay | Admitting: General Surgery

## 2018-12-27 ENCOUNTER — Ambulatory Visit (INDEPENDENT_AMBULATORY_CARE_PROVIDER_SITE_OTHER): Payer: Medicare Other | Admitting: General Surgery

## 2018-12-27 ENCOUNTER — Ambulatory Visit: Payer: BLUE CROSS/BLUE SHIELD | Admitting: General Surgery

## 2018-12-27 ENCOUNTER — Other Ambulatory Visit: Payer: Self-pay

## 2018-12-27 VITALS — BP 121/78 | HR 80 | Temp 97.5°F | Resp 14 | Ht 62.75 in | Wt 157.0 lb

## 2018-12-27 DIAGNOSIS — Z1231 Encounter for screening mammogram for malignant neoplasm of breast: Secondary | ICD-10-CM

## 2018-12-27 NOTE — Patient Instructions (Addendum)
Patient will need to return to the office as needed.    Call the office with any questions or concerns. 

## 2018-12-27 NOTE — Progress Notes (Signed)
Patient ID: Christine Arias, female   DOB: 14-Jan-1956, 63 y.o.   MRN: [id number]  Chief Complaint  Patient presents with  . Follow-up    one year f/u recall bil screening mammo [facility] 12/20/18,    HPI Christine Arias is a 63 y.o. female here today to follow up for 1 year bilateral mammogram screening.Patient states she is feeling well.  HPI  Past Medical History:  Diagnosis Date  . Allergy    seasonal  . Diffuse cystic mastopathy   . History of diverticulitis of colon 2003  . Melanoma ([place]) 2013   T1b N1 excised from left hip region-016mm deep, sn positive. Subsequently had left inguinal node dissection and interferon therapy  . Personal history of other malignant neoplasm of skin 2014  . Special screening for malignant neoplasms, colon     Past Surgical History:  Procedure Laterality Date  . APPENDECTOMY    . BREAST BIOPSY Left    neg core 2014  . BREAST CYST ASPIRATION Bilateral 2001  . CESAREAN SECTION    . COLON SURGERY  2009   colectomy-sigmoid, hand assist  . COLONOSCOPY  2008, 2015   Dr. Jamal Collin  . INGUINAL LYMPH NODE BIOPSY Left 2013   left inguinal node dissection,positve sn d/t melanoma left hip region.  Marland Kitchen LAPAROSCOPY  2009  . MELANOMA EXCISION Left 2013   excised from hip region-0.55mm deep, sentinel node positive. Subsequently had left inquinal node dissection and interferon therapy  . TONSILLECTOMY AND ADENOIDECTOMY    . TUBAL LIGATION      Family History  Problem Relation Age of Onset  . Cancer Father        cancer of throat, cancer of lung  . Colon polyps Mother   . Breast cancer Maternal Aunt 55  . Breast cancer Maternal Aunt 80  . Breast cancer Maternal Aunt 60    Social History Social History   Tobacco Use  . Smoking status: Never Smoker  . Smokeless tobacco: Never Used  Substance Use Topics  . Alcohol use: Yes    Alcohol/week: 0.0 standard drinks    Comment: ocassionally  . Drug use: No    No Known Allergies  Current Outpatient Medications   Medication Sig Dispense Refill  . Biotin 1000 MCG tablet Take 1,000 mcg by mouth daily.    . calcium carbonate (OS-CAL) 600 MG TABS tablet Take 600 mg by mouth daily with breakfast.    . escitalopram (LEXAPRO) 10 MG tablet Take 10 mg by mouth daily.    Marland Kitchen esomeprazole (NEXIUM) 40 MG capsule Take 40 mg by mouth daily at 12 noon.    . fexofenadine (ALLEGRA ALLERGY) 180 MG tablet Take 180 mg by mouth daily.    . furosemide (LASIX) 20 MG tablet Take by mouth.    . Misc Natural Products (GLUCOSAMINE CHONDROITIN ADV PO) Take by mouth daily.     No current facility-administered medications for this visit.     Review of Systems Review of Systems  Constitutional: Negative.   Respiratory: Negative.   Cardiovascular: Negative.     Blood pressure 121/78, pulse 80, temperature (!) 97.5 F (36.4 C), temperature source Temporal, resp. rate 14, height 5' 2.75" (1.594 m), weight 157 lb (71.2 kg), SpO2 96 %.  Physical Exam Physical Exam Constitutional:      Appearance: She is well-developed.  Eyes:     General: No scleral icterus.    Conjunctiva/sclera: Conjunctivae normal.  Neck:     Musculoskeletal: Normal range of motion.  Cardiovascular:     Rate and Rhythm: Normal rate and regular rhythm.     Heart sounds: Normal heart sounds.  Pulmonary:     Effort: Pulmonary effort is normal.     Breath sounds: Normal breath sounds.  Chest:     Breasts: Breasts are symmetrical.        Right: No inverted nipple, mass, nipple discharge, skin change or tenderness.        Left: No inverted nipple, mass, nipple discharge, skin change or tenderness.  Lymphadenopathy:     Cervical: No cervical adenopathy.  Skin:    General: Skin is warm and dry.  Neurological:     Mental Status: She is alert and oriented to person, place, and time.     Data Reviewed December 21, 2018 bilateral screening mammograms reviewed.  BI-RADS-2.  Assessment    Benign breast exam.    Plan  The patient is moving to  [facility].  She will contact the office if she needs records forwarded.  More importantly, [facility] can send her mammograms to the new facility for comparison.  HPI, Physical Exam, Assessment and Plan have been scribed under the direction and in the presence of Hervey Ard, Md.  Eudelia Bunch R. Bobette Mo, CMA  I have completed the exam and reviewed the above documentation for accuracy and completeness.  I agree with the above.  Haematologist has been used and any errors in dictation or transcription are unintentional.  Hervey Ard, M.D., F.A.C.S.  Forest Gleason  12/28/2018, 5:34 AM

## 2023-04-16 ENCOUNTER — Other Ambulatory Visit: Payer: Self-pay

## 2023-04-16 ENCOUNTER — Inpatient Hospital Stay
Admission: EM | Admit: 2023-04-16 | Discharge: 2023-04-17 | DRG: 563 | Disposition: A | Discharge status: Other Acute Inpatient Hospital | Payer: Medicare Other | Attending: Internal Medicine | Admitting: Internal Medicine

## 2023-04-16 ENCOUNTER — Emergency Department: Payer: Medicare Other

## 2023-04-16 DIAGNOSIS — S32591A Other specified fracture of right pubis, initial encounter for closed fracture: Secondary | ICD-10-CM | POA: Diagnosis present

## 2023-04-16 DIAGNOSIS — Z9851 Tubal ligation status: Secondary | ICD-10-CM | POA: Diagnosis not present

## 2023-04-16 DIAGNOSIS — S52031A Displaced fracture of olecranon process with intraarticular extension of right ulna, initial encounter for closed fracture: Principal | ICD-10-CM | POA: Diagnosis present

## 2023-04-16 DIAGNOSIS — S329XXA Fracture of unspecified parts of lumbosacral spine and pelvis, initial encounter for closed fracture: Secondary | ICD-10-CM | POA: Diagnosis not present

## 2023-04-16 DIAGNOSIS — R42 Dizziness and giddiness: Secondary | ICD-10-CM

## 2023-04-16 DIAGNOSIS — E860 Dehydration: Secondary | ICD-10-CM | POA: Diagnosis present

## 2023-04-16 DIAGNOSIS — G4733 Obstructive sleep apnea (adult) (pediatric): Secondary | ICD-10-CM

## 2023-04-16 DIAGNOSIS — D649 Anemia, unspecified: Secondary | ICD-10-CM | POA: Diagnosis present

## 2023-04-16 DIAGNOSIS — R55 Syncope and collapse: Secondary | ICD-10-CM | POA: Diagnosis present

## 2023-04-16 DIAGNOSIS — S42401A Unspecified fracture of lower end of right humerus, initial encounter for closed fracture: Principal | ICD-10-CM | POA: Diagnosis present

## 2023-04-16 DIAGNOSIS — Z85828 Personal history of other malignant neoplasm of skin: Secondary | ICD-10-CM | POA: Diagnosis not present

## 2023-04-16 DIAGNOSIS — Z79899 Other long term (current) drug therapy: Secondary | ICD-10-CM | POA: Diagnosis not present

## 2023-04-16 DIAGNOSIS — Z85820 Personal history of malignant melanoma of skin: Secondary | ICD-10-CM | POA: Diagnosis not present

## 2023-04-16 DIAGNOSIS — Y9301 Activity, walking, marching and hiking: Secondary | ICD-10-CM | POA: Diagnosis present

## 2023-04-16 DIAGNOSIS — W108XXA Fall (on) (from) other stairs and steps, initial encounter: Secondary | ICD-10-CM | POA: Diagnosis present

## 2023-04-16 DIAGNOSIS — Y92008 Other place in unspecified non-institutional (private) residence as the place of occurrence of the external cause: Secondary | ICD-10-CM | POA: Diagnosis not present

## 2023-04-16 DIAGNOSIS — Z9049 Acquired absence of other specified parts of digestive tract: Secondary | ICD-10-CM | POA: Diagnosis not present

## 2023-04-16 DIAGNOSIS — M25551 Pain in right hip: Secondary | ICD-10-CM | POA: Diagnosis present

## 2023-04-16 DIAGNOSIS — S32810A Multiple fractures of pelvis with stable disruption of pelvic ring, initial encounter for closed fracture: Secondary | ICD-10-CM

## 2023-04-16 HISTORY — DX: Displaced bicondylar fracture of left tibia, initial encounter for closed fracture: S82.142A

## 2023-04-16 LAB — BASIC METABOLIC PANEL
Anion gap: 8 (ref 5–15)
BUN: 20 mg/dL (ref 8–23)
CO2: 27 mmol/L (ref 22–32)
Calcium: 9.2 mg/dL (ref 8.9–10.3)
Chloride: 105 mmol/L (ref 98–111)
Creatinine, Ser: 0.73 mg/dL (ref 0.44–1.00)
GFR, Estimated: >60 mL/min (ref >60)
Glucose, Bld: 116 mg/dL — ABNORMAL HIGH (ref 70–99)
Potassium: 3.5 mmol/L (ref 3.5–5.1)
Sodium: 140 mmol/L (ref 135–145)

## 2023-04-16 LAB — CBC
HCT: 36.3 % (ref 36.0–46.0)
Hemoglobin: 11.9 g/dL — ABNORMAL LOW (ref 12.0–15.0)
MCH: 31.1 pg (ref 26.0–34.0)
MCHC: 32.8 g/dL (ref 30.0–36.0)
MCV: 94.8 fL (ref 80.0–100.0)
Platelets: 225 10*3/uL (ref 150–400)
RBC: 3.83 MIL/uL — ABNORMAL LOW (ref 3.87–5.11)
RDW: 13.4 % (ref 11.5–15.5)
WBC: 8.7 10*3/uL (ref 4.0–10.5)
nRBC: 0 % (ref 0.0–0.2)

## 2023-04-16 LAB — TROPONIN I (HIGH SENSITIVITY)
Troponin I (High Sensitivity): 8 ng/L (ref <18)
Troponin I (High Sensitivity): 5 ng/L (ref <18)

## 2023-04-16 MED ORDER — MORPHINE SULFATE (PF) 4 MG/ML IV SOLN
4.0000 mg | Freq: Once | INTRAVENOUS | Status: AC
  Administered 2023-04-16: 4 mg via INTRAVENOUS
  Filled 2023-04-16: qty 1

## 2023-04-16 MED ORDER — METHOCARBAMOL 500 MG PO TABS: 500.0000 mg | ORAL_TABLET | Freq: Four times a day (QID) | ORAL | Status: DC | PRN

## 2023-04-16 MED ORDER — METHOCARBAMOL 1000 MG/10ML IJ SOLN
500.0000 mg | Freq: Four times a day (QID) | INTRAVENOUS | Status: DC | PRN
  Filled 2023-04-16: qty 5

## 2023-04-16 MED ORDER — MORPHINE SULFATE (PF) 2 MG/ML IV SOLN
2.0000 mg | INTRAVENOUS | Status: DC | PRN
  Administered 2023-04-17 (×2): 2 mg via INTRAVENOUS
  Filled 2023-04-16 (×2): qty 1

## 2023-04-16 MED ORDER — SODIUM CHLORIDE 0.9 % IV SOLN: INTRAVENOUS | Status: DC

## 2023-04-16 MED ORDER — CEFAZOLIN SODIUM-DEXTROSE 2-4 GM/100ML-% IV SOLN
2.0000 g | INTRAVENOUS | Status: DC
  Filled 2023-04-16: qty 100

## 2023-04-16 MED ORDER — ONDANSETRON HCL 4 MG/2ML IJ SOLN: 4.0000 mg | Freq: Four times a day (QID) | INTRAMUSCULAR | Status: DC | PRN

## 2023-04-16 MED ORDER — MELOXICAM 7.5 MG PO TABS
15.0000 mg | ORAL_TABLET | Freq: Once | ORAL | Status: DC
  Filled 2023-04-16: qty 2

## 2023-04-16 MED ORDER — KETOROLAC TROMETHAMINE 15 MG/ML IJ SOLN
15.0000 mg | Freq: Once | INTRAMUSCULAR | Status: AC
  Administered 2023-04-16: 15 mg via INTRAVENOUS
  Filled 2023-04-16: qty 1

## 2023-04-16 MED ORDER — MORPHINE SULFATE (PF) 2 MG/ML IV SOLN
1.0000 mg | INTRAVENOUS | Status: DC | PRN
  Administered 2023-04-16 – 2023-04-17 (×2): 1 mg via INTRAVENOUS
  Filled 2023-04-16 (×2): qty 1

## 2023-04-16 MED ORDER — HYDROCODONE-ACETAMINOPHEN 5-325 MG PO TABS
1.0000 | ORAL_TABLET | Freq: Four times a day (QID) | ORAL | Status: DC | PRN
  Administered 2023-04-17: 2 via ORAL
  Filled 2023-04-16: qty 2

## 2023-04-16 MED ORDER — PROPOFOL 10 MG/ML IV BOLUS
1.0000 mg/kg | Freq: Once | INTRAVENOUS | Status: AC
  Administered 2023-04-16: 75.8 mg via INTRAVENOUS
  Filled 2023-04-16: qty 20

## 2023-04-16 MED ORDER — GABAPENTIN 400 MG PO CAPS
400.0000 mg | ORAL_CAPSULE | Freq: Two times a day (BID) | ORAL | Status: DC
  Administered 2023-04-16: 400 mg via ORAL
  Filled 2023-04-16: qty 1

## 2023-04-16 MED ORDER — ONDANSETRON HCL 4 MG/2ML IJ SOLN
4.0000 mg | Freq: Once | INTRAMUSCULAR | Status: AC
  Administered 2023-04-16: 4 mg via INTRAVENOUS
  Filled 2023-04-16: qty 2

## 2023-04-16 NOTE — ED Triage Notes (Signed)
Pt top ED with husband POV after fall about 1/2 hour ago, fell to R side, R shoulder arm elbow and hip and groin are hurting. Large hematoma to R elbow. Pt holding R arm in position of comfort( bent). Most of pain is to R elbow. Pt states felt lightheaded before she fell.

## 2023-04-16 NOTE — Assessment & Plan Note (Addendum)
Remote history of tachycardia and shortness of breath 2017 with negative cardiac workup Patient felt lightheaded prior to fall CT head with no acute findings Troponin negative and EKG nonacute Patient had a long car ride some hours prior to to the episode Will get D-dimer though likely to be elevated because of fracture but if negative we will rule out VTE If elevated will get CTA chest to rule out PE Continuous cardiac monitoring and echocardiogram

## 2023-04-16 NOTE — Assessment & Plan Note (Signed)
No acute disused suspected 

## 2023-04-16 NOTE — ED Notes (Signed)
Pt given snack and water at this time. 

## 2023-04-16 NOTE — ED Notes (Signed)
ED Provider at bedside. 

## 2023-04-16 NOTE — ED Provider Triage Note (Signed)
Emergency Medicine Provider Triage Evaluation Note  Christine Arias , a 67 y.o. female  was evaluated in triage.  Pt complains of pain in right shoulder, elbow, hip, and pelvis after having a dizzy spell, then falling off her porch onto concrete. Porch was about 2 steps in elevation. No loss of consciousness, but she is unsure if she hit her head.  Physical Exam  Ht 5\' 3"  (1.6 m)   Wt 75.8 kg   BMI 29.58 kg/m  Gen:   Awake, no distress   Resp:  Normal effort  MSK:   Decreased ability to perform ROM of right shoulder and right elbow. Other:    Medical Decision Making  Medically screening exam initiated at 4:52 PM.  Appropriate orders placed.  Christine Arias was informed that the remainder of the evaluation will be completed by another provider, this initial triage assessment does not replace that evaluation, and the importance of remaining in the ED until their evaluation is complete.    Chinita Pester, FNP 04/16/23 1657

## 2023-04-16 NOTE — ED Provider Notes (Signed)
[facility] Provider Note    Event Date/Time   First MD Initiated Contact with Patient 04/16/23 1957     (approximate)   History   Chief Complaint: Fall and Arm Injury   HPI  Christine Arias is a 67 y.o. female with no significant past medical history who was walking up stairs into her house carrying groceries when she fell onto her right side, resulting in immediate severe pain in the right hip and the right elbow.  No head injury or loss of consciousness.  Denies headache or neck pain.  No paresthesias.  Unable to stand and bear weight on the right leg due to pain.  She does report recently returning from [facility], and probably being dehydrated due to avoiding fluid intake during the drive back to avoid having to make bathroom stops.     Physical Exam   Triage Vital Signs: ED Triage Vitals  Enc Vitals Group     BP 04/16/23 1652 130/89     Pulse Rate 04/16/23 1652 79     Resp 04/16/23 1652 20     Temp 04/16/23 1652 98 F (36.7 C)     Temp Source 04/16/23 1652 Oral     SpO2 04/16/23 1652 94 %     Weight 04/16/23 1650 167 lb (75.8 kg)     Height 04/16/23 1650 5\' 3"  (1.6 m)     Head Circumference --      Peak Flow --      Pain Score 04/16/23 1650 10     Pain Loc --      Pain Edu? --      Excl. in [place]? --     Most recent vital signs: Vitals:   04/16/23 2315 04/17/23 0000  BP: 129/68 126/66  Pulse: 97 90  Resp: 11 19  Temp:    SpO2: 92% 90%    General: Awake, no distress.  CV:  Good peripheral perfusion.  Regular rate and rhythm.  Normal distal pulses Resp:  Normal effort.  Clear to auscultation bilaterally Abd:  No distention.  Soft nontender Other:  Right elbow with pronounced swelling and ecchymosis over the posterior joint.  Pain with any range of motion.  Exquisite tenderness.  There is also tenderness over the right hip.  No lacerations, only mild abrasions.   ED Results / Procedures / Treatments   Labs (all labs ordered are  listed, but only abnormal results are displayed) Labs Reviewed  BASIC METABOLIC PANEL - Abnormal; Notable for the following components:      Result Value   Glucose, Bld 116 (*)    All other components within normal limits  CBC - Abnormal; Notable for the following components:   RBC 3.83 (*)    Hemoglobin 11.9 (*)    All other components within normal limits  HIV ANTIBODY (ROUTINE TESTING W REFLEX)  TROPONIN I (HIGH SENSITIVITY)  TROPONIN I (HIGH SENSITIVITY)     EKG Interpreted by me Normal sinus rhythm rate of 73.  Normal axis, normal intervals.  Normal QRS ST segments and T waves.   RADIOLOGY X-ray right elbow interpreted by me, shows comminuted fracture of the olecranon.  Radiology report reviewed.  X-ray right hip interpreted by me, negative for femur fracture.  There are nondisplaced fractures of the lateral aspect of the right superior and inferior pubic rami.  CT head and neck negative.  X-ray right shoulder and chest unremarkable.   PROCEDURES:  .Ortho Injury Treatment  Date/Time:  04/17/2023 12:26 AM  Performed by: Sharman Cheek, MD Authorized by: Sharman Cheek, MD   Consent:    Consent obtained:  Written   Consent given by:  Patient   Risks discussed:  Restricted joint movement, stiffness and nerve damage   Alternatives discussed:  No treatmentInjury location: elbow Location details: right elbow Injury type: fracture-dislocation Fracture-dislocation type: [place] Pre-procedure neurovascular assessment: neurovascularly intact Pre-procedure distal perfusion: normal Pre-procedure neurological function: normal Pre-procedure range of motion: reduced  Anesthesia: Local anesthesia used: no  Patient sedated: Yes. Refer to sedation procedure documentation for details of sedation. Manipulation performed: no Immobilization: splint Splint type: long arm Splint Applied by: ED Provider Supplies used: cotton padding, elastic bandage and  Ortho-Glass Post-procedure neurovascular assessment: post-procedure neurovascularly intact Post-procedure distal perfusion: normal Post-procedure neurological function: normal Post-procedure range of motion: unchanged Comments:      .Sedation  Date/Time: 04/17/2023 12:29 AM  Performed by: Sharman Cheek, MD Authorized by: Sharman Cheek, MD   Consent:    Consent obtained:  Written   Consent given by:  Patient   Risks discussed:  Inadequate sedation, nausea, vomiting and respiratory compromise necessitating ventilatory assistance and intubation   Alternatives discussed:  Analgesia without sedation Universal protocol:    Immediately prior to procedure, a time out was called: yes     Patient identity confirmed:  Arm band and verbally with patient Indications:    Procedure performed:  Fracture reduction   Procedure necessitating sedation performed by:  Physician performing sedation Pre-sedation assessment:    Time since last food or drink:  8h   ASA classification: class 2 - patient with mild systemic disease     Mouth opening:  3 or more finger widths   Thyromental distance:  4 finger widths   Mallampati score:  I - soft palate, uvula, fauces, pillars visible   Neck mobility: normal     Pre-sedation assessments completed and reviewed: airway patency, cardiovascular function, hydration status, mental status, nausea/vomiting, pain level and respiratory function     Pre-sedation assessment completed:  04/16/2023 10:15 PM Immediate pre-procedure details:    Reassessment: Patient reassessed immediately prior to procedure     Reviewed: vital signs, relevant labs/tests and NPO status     Verified: bag valve mask available, emergency equipment available, intubation equipment available, IV patency confirmed, oxygen available and suction available   Procedure details (see MAR for exact dosages):    Preoxygenation:  Nasal cannula   Sedation:  Propofol   Intended level of sedation:  deep   Analgesia:  Morphine   Intra-procedure monitoring:  Blood pressure monitoring, cardiac monitor, continuous pulse oximetry, continuous capnometry, frequent LOC assessments and frequent vital sign checks   Intra-procedure events: none     Total Provider sedation time (minutes):  15 Post-procedure details:    Post-sedation assessment completed:  04/16/2023 11:15 PM   Attendance: Constant attendance by certified staff until patient recovered     Recovery: Patient returned to pre-procedure baseline     Post-sedation assessments completed and reviewed: airway patency, cardiovascular function, hydration status, mental status, nausea/vomiting, pain level and respiratory function     Patient is stable for discharge or admission: yes     Procedure completion:  Tolerated well, no immediate complications    MEDICATIONS ORDERED IN ED: Medications  0.9 %  sodium chloride infusion ( Intravenous New Bag/Given 04/16/23 2222)  ceFAZolin (ANCEF) IVPB 2g/100 mL premix (has no administration in time range)  gabapentin (NEURONTIN) capsule 400 mg (400 mg Oral Given 04/16/23 2211)  morphine (PF) 2 MG/ML injection 1 mg (1 mg Intravenous Given 04/16/23 2211)  meloxicam (MOBIC) tablet 15 mg (0 mg Oral Hold 04/16/23 2223)  HYDROcodone-acetaminophen (NORCO/VICODIN) 5-325 MG per tablet 1-2 tablet (has no administration in time range)  morphine (PF) 2 MG/ML injection 2 mg (has no administration in time range)  methocarbamol (ROBAXIN) tablet 500 mg (has no administration in time range)    Or  methocarbamol (ROBAXIN) 500 mg in dextrose 5 % 50 mL IVPB (has no administration in time range)  ondansetron (ZOFRAN) injection 4 mg (has no administration in time range)  ketorolac (TORADOL) 15 MG/ML injection 15 mg (15 mg Intravenous Given 04/16/23 2046)  morphine (PF) 4 MG/ML injection 4 mg (4 mg Intravenous Given 04/16/23 2045)  morphine (PF) 4 MG/ML injection 4 mg (4 mg Intravenous Given 04/16/23 2248)  propofol (DIPRIVAN) 10 mg/mL  bolus/IV push 75.8 mg (75.8 mg Intravenous Given 04/16/23 2257)  ondansetron (ZOFRAN) injection 4 mg (4 mg Intravenous Given 04/16/23 2248)     IMPRESSION / MDM / ASSESSMENT AND PLAN / ED COURSE  I reviewed the triage vital signs and the nursing notes.  DDx: Elbow fracture, elbow dislocation, hip fracture, contusions, AKI, electrolyte abnormality  Patient's presentation is most consistent with acute presentation with potential threat to life or bodily function.  Patient presents with fall.  Initial imaging shows comminuted fracture of the right olecranon.  Also nondisplaced pubic rami fractures on the right.  Due to pain and inability to bear weight on the leg, will obtain CT pelvis along with CT right elbow.   Clinical Course as of 04/17/23 0026  Wynelle Link Apr 16, 2023  2226 CTs reviewed. D/w Dr. Hyacinth Meeker who rec. Hospitalist admission and planning for surgery tomorroe 3pm. Will place posterior splint for now. Pt requests deep sedation for splinting and has had good experience with propofol in the past.  [PS]  2305 Posterior Splint placed on RUE. Pt awake and back to normal mental status.  [PS]    Clinical Course User Index [PS] Sharman Cheek, MD     FINAL CLINICAL IMPRESSION(S) / ED DIAGNOSES   Final diagnoses:  Fracture dislocation of elbow joint, right, closed, initial encounter  Multiple closed fractures of pelvis with stable disruption of pelvic ring, initial encounter (HCC)     Rx / DC Orders   ED Discharge Orders     None        Note:  This document was prepared using Dragon voice recognition software and may include unintentional dictation errors.   Sharman Cheek, MD 04/17/23 [phone]

## 2023-04-16 NOTE — Assessment & Plan Note (Signed)
Pain control Nonsurgical Dr. Hyacinth Meeker was consulted from the ED Further recommendations per Dr. Hyacinth Meeker

## 2023-04-16 NOTE — H&P (Incomplete)
History and Physical    Patient: Christine Arias:[id number] DOB: 03/05/1956 DOA: 04/16/2023 DOS: the patient was seen and examined on 04/16/2023 PCP: System, Provider Not In  Patient coming from: Home  Chief Complaint:  Chief Complaint  Patient presents with   Fall   Arm Injury    HPI: Christine Arias is a 67 y.o. female with medical history significant for  tachycardia and shortness of breath of uncertain etiology, previously followed by cardiologist, Dr. Lady Gary with unremarkable Holter, echo and nuclear stress test in 2017, with no recent studies, who presentsThe ED following a fall onto her right side which was preceded by lightheadedness.  She had immediate pain to the right elbow and right hip.  She was otherwise previously in her usual state of health. ED course and data review: Vitals within normal limits.  Troponin 5, mild anemia of 11.9 with otherwise normal labs. EKG, personally reviewed and interpreted showing NSR at 73 with no concerning ST-T wave changes. Extensive trauma imaging significant for right elbow fracture and right pelvis fracture. CT head and C-spine negative, right shoulder x-ray without injury and chest x-ray was nonacute. The ED provider spoke with orthopedist Dr. Hyacinth Meeker who will take patient to the OR at 3 PM on 6/3. He recommended splinting in the ED under propofol which will be done by ED provider. Patient was treated with Toradol and morphine for pain. Hospitalist consulted for admission.   Review of Systems: {ROS_Text:26778}  Past Medical History:  Diagnosis Date   Allergy    seasonal   Diffuse cystic mastopathy    History of diverticulitis of colon 2003   Melanoma (HCC) 2013   T1b N1 excised from left hip region-067mm deep, sn positive. Subsequently had left inguinal node dissection and interferon therapy   Personal history of other malignant neoplasm of skin 2014   Special screening for malignant neoplasms, colon    Tibial plateau fracture, left     no surgery. 2020   Past Surgical History:  Procedure Laterality Date   APPENDECTOMY     BREAST BIOPSY Left    neg core 2014   BREAST CYST ASPIRATION Bilateral 2001   CESAREAN SECTION     COLON SURGERY  2009   colectomy-sigmoid, hand assist   COLONOSCOPY  2008, 2015   Dr. Evette Cristal   INGUINAL LYMPH NODE BIOPSY Left 2013   left inguinal node dissection,positve sn d/t melanoma left hip region.   LAPAROSCOPY  2009   MELANOMA EXCISION Left 2013   excised from hip region-0.41mm deep, sentinel node positive. Subsequently had left inquinal node dissection and interferon therapy   TONSILLECTOMY AND ADENOIDECTOMY     TUBAL LIGATION     Social History:  reports that she has never smoked. She has never used smokeless tobacco. She reports current alcohol use. She reports that she does not use drugs.  No Known Allergies  Family History  Problem Relation Age of Onset   Cancer Father        cancer of throat, cancer of lung   Colon polyps Mother    Breast cancer Maternal Aunt 33   Breast cancer Maternal Aunt 101   Breast cancer Maternal Aunt 60    Prior to Admission medications   Medication Sig Start Date End Date Taking? Authorizing Provider  Biotin 1000 MCG tablet Take 1,000 mcg by mouth daily.   Yes [provider]  busPIRone (BUSPAR) 10 MG tablet Take 10 mg by mouth 3 (three) times daily.   Yes  [provider]  calcium carbonate (OS-CAL) 600 MG TABS tablet Take 600 mg by mouth daily with breakfast.   Yes [provider]  escitalopram (LEXAPRO) 10 MG tablet Take 10 mg by mouth daily.   Yes [provider]  metoCLOPramide (REGLAN) 10 MG tablet Take 10 mg by mouth 3 (three) times daily as needed. 03/29/23  Yes [provider]  Misc Natural Products (GLUCOSAMINE CHONDROITIN ADV PO) Take by mouth daily.   Yes [provider]  pantoprazole (PROTONIX) 40 MG tablet Take 40 mg by mouth 2 (two) times daily. 09/07/21  Yes [provider]   rOPINIRole (REQUIP) 3 MG tablet Take 3 mg by mouth at bedtime.   Yes [provider]  rosuvastatin (CRESTOR) 10 MG tablet Take 10 mg by mouth daily.   Yes [provider]  sucralfate (CARAFATE) 1 g tablet Take 1 g by mouth 2 (two) times daily. 03/10/23  Yes [provider]    Physical Exam: Vitals:   04/16/23 1650 04/16/23 1652 04/16/23 2031  BP:  130/89 (!) 140/71  Pulse:  79 81  Resp:  20 20  Temp:  98 F (36.7 C)   TempSrc:  Oral   SpO2:  94% 99%  Weight: 75.8 kg    Height: 5\' 3"  (1.6 m)     Physical Exam  Labs on Admission: I have personally reviewed following labs and imaging studies  CBC: Recent Labs  Lab 04/16/23 1655  WBC 8.7  HGB 11.9*  HCT 36.3  MCV 94.8  PLT 225   Basic Metabolic Panel: Recent Labs  Lab 04/16/23 1655  NA 140  K 3.5  CL 105  CO2 27  GLUCOSE 116*  BUN 20  CREATININE 0.73  CALCIUM 9.2   GFR: Estimated Creatinine Clearance: 64.8 mL/min (by C-G formula based on SCr of 0.73 mg/dL). Liver Function Tests: No results for input(s): "AST", "ALT", "ALKPHOS", "BILITOT", "PROT", "ALBUMIN" in the last 168 hours. No results for input(s): "LIPASE", "AMYLASE" in the last 168 hours. No results for input(s): "AMMONIA" in the last 168 hours. Coagulation Profile: No results for input(s): "INR", "PROTIME" in the last 168 hours. Cardiac Enzymes: No results for input(s): "CKTOTAL", "CKMB", "CKMBINDEX", "TROPONINI" in the last 168 hours. BNP (last 3 results) No results for input(s): "PROBNP" in the last 8760 hours. HbA1C: No results for input(s): "HGBA1C" in the last 72 hours. CBG: No results for input(s): "GLUCAP" in the last 168 hours. Lipid Profile: No results for input(s): "CHOL", "HDL", "LDLCALC", "TRIG", "CHOLHDL", "LDLDIRECT" in the last 72 hours. Thyroid Function Tests: No results for input(s): "TSH", "T4TOTAL", "FREET4", "T3FREE", "THYROIDAB" in the last 72 hours. Anemia Panel: No results for input(s):  "VITAMINB12", "FOLATE", "FERRITIN", "TIBC", "IRON", "RETICCTPCT" in the last 72 hours. Urine analysis:    Component Value Date/Time   APPEARANCEUR Clear 05/31/2016 1653   GLUCOSEU Negative 05/31/2016 1653   BILIRUBINUR Negative 05/31/2016 1653   PROTEINUR Negative 05/31/2016 1653   NITRITE Negative 05/31/2016 1653   LEUKOCYTESUR Negative 05/31/2016 1653    Radiological Exams on Admission: CT PELVIS WO CONTRAST  Result Date: 04/16/2023 CLINICAL DATA:  Pelvic fracture EXAM: CT PELVIS WITHOUT CONTRAST TECHNIQUE: Multidetector CT imaging of the pelvis was performed following the standard protocol without intravenous contrast. RADIATION DOSE REDUCTION: This exam was performed according to the departmental dose-optimization program which includes automated exposure control, adjustment of the mA and/or kV according to patient size and/or use of iterative reconstruction technique. COMPARISON:  CT 04/28/2016 FINDINGS: Urinary Tract:  Urinary bladder physiologically distended. Bowel: Anastomotic staple line in the proximal sigmoid colon. Visualized small bowel and colon are nondistended. Vascular/Lymphatic: No abdominal or pelvic adenopathy. Surgical clips about the left common femoral vessels. Reproductive:  No mass or other significant abnormality Other:  No pelvic ascites.  No free air. Musculoskeletal: Right sacral ala fracture, minimally displaced, with small adjacent anterior pelvic hematoma. Superior right pubic ramus fracture, minimally displaced. Inferior right ischiopubic ramus fracture, minimally displaced. IMPRESSION: Minimally displaced right sacral ala, right superior pubic ramus, and right inferior ischiopubic ramus fractures. Electronically Signed   By: Corlis Leak M.D.   On: 04/16/2023 21:48   CT Elbow Right Wo Contrast  Result Date: 04/16/2023 CLINICAL DATA:  Elbow trauma EXAM: CT OF THE UPPER RIGHT EXTREMITY WITHOUT CONTRAST TECHNIQUE: Multidetector CT imaging of the upper right extremity  was performed according to the standard protocol. RADIATION DOSE REDUCTION: This exam was performed according to the departmental dose-optimization program which includes automated exposure control, adjustment of the mA and/or kV according to patient size and/or use of iterative reconstruction technique. COMPARISON:  Right elbow x-ray same day FINDINGS: Bones/Joint/Cartilage There is a comminuted fracture of the ulna. Fracture fragments are distracted up to 2 cm at the level of the distal humerus. There is intra-articular extension and inferior displacement of the distal humerus as it articulates with the ulna (1 cm). There is also anterior radial head dislocation/subluxation. No joint effusion identified. Ligaments Suboptimally assessed by CT. Muscles and Tendons Grossly within normal limits on this noncontrast study. Soft tissues There is posterior subcutaneous edema and hemorrhage of the elbow. IMPRESSION: 1. Comminuted displaced fracture of the distal humerus with intra-articular extension. 2. Anterior radial head dislocation/subluxation. Electronically Signed   By: Darliss Cheney M.D.   On: 04/16/2023 21:46   DG Shoulder Right  Result Date: 04/16/2023 CLINICAL DATA:  Larey Seat 30 minutes ago.  Right shoulder pain. EXAM: RIGHT SHOULDER - 2+ VIEW COMPARISON:  None Available. FINDINGS: There is no evidence of fracture or dislocation. There is no evidence of arthropathy or other focal bone abnormality. Soft tissues are unremarkable. IMPRESSION: Negative. Electronically Signed   By: Burman Nieves M.D.   On: 04/16/2023 18:05   DG Hip Unilat W or Wo Pelvis 2-3 Views Right  Result Date: 04/16/2023 CLINICAL DATA:  Pain 30 minutes ago with right hip and groin pain. EXAM: DG HIP (WITH OR WITHOUT PELVIS) 2-3V RIGHT COMPARISON:  None Available. FINDINGS: No evidence of acute fracture or dislocation of the right hip. Nondisplaced fractures are demonstrated in the superior and inferior pubic rami. SI joints and symphysis  pubis are not displaced. Mild degenerative changes in the right hip. Surgical clips in the left groin region. IMPRESSION: Nondisplaced fractures of the right superior and inferior pubic rami. Right hip appears otherwise intact. Electronically Signed   By: Burman Nieves M.D.   On: 04/16/2023 18:04   DG Elbow Complete Right  Result Date: 04/16/2023 CLINICAL DATA:  Larey Seat about 30 minutes ago with right elbow pain and hematoma. EXAM: RIGHT ELBOW - COMPLETE 3+ VIEW COMPARISON:  None Available. FINDINGS: Multiple comminuted fractures of the right olecranon with displacement of fracture fragments. Fracture lines extend to the articular surface of the ulnar trochlear joint. The radiocapitellar joint appears intact. Associated right elbow effusion and prominent soft tissue swelling. IMPRESSION: Comminuted intra-articular and displaced fractures of the right olecranon. Associated soft tissue swelling and right elbow effusion. Electronically Signed   By: Burman Nieves M.D.   On: 04/16/2023 18:03  DG Chest 2 View  Result Date: 04/16/2023 CLINICAL DATA:  Fall about 30 minutes ago. EXAM: CHEST - 2 VIEW COMPARISON:  06/13/2017 FINDINGS: The heart size and mediastinal contours are within normal limits. Both lungs are clear. The visualized skeletal structures are unremarkable. IMPRESSION: No active cardiopulmonary disease. Electronically Signed   By: Burman Nieves M.D.   On: 04/16/2023 18:01   CT Cervical Spine Wo Contrast  Result Date: 04/16/2023 CLINICAL DATA:  Neck trauma EXAM: CT CERVICAL SPINE WITHOUT CONTRAST TECHNIQUE: Multidetector CT imaging of the cervical spine was performed without intravenous contrast. Multiplanar CT image reconstructions were also generated. RADIATION DOSE REDUCTION: This exam was performed according to the departmental dose-optimization program which includes automated exposure control, adjustment of the mA and/or kV according to patient size and/or use of iterative reconstruction  technique. COMPARISON:  Cervical spine radiographs 06/04/2010 FINDINGS: Alignment: Reversal of the usual cervical lordosis without anterior subluxation. Alignment is similar to prior study and likely reflects degenerative change. Muscle spasm could also have this appearance. Skull base and vertebrae: Skull base appears intact. No vertebral compression deformities. No focal bone lesion or bone destruction. Soft tissues and spinal canal: No prevertebral soft tissue swelling. No abnormal paraspinal soft tissue mass or infiltration. Disc levels: Degenerative changes with narrowed disc spaces and endplate osteophyte formation most prominent at C3-4, C4-5, and C5-6 levels. Since the previous study, there is progressive loss of disc height at C3-4 with prominent osteophyte formation and partial degenerative coalition. Degenerative changes in the posterior facet joints. Uncovertebral and facet joint spurring causes some bone encroachment upon neural foramina bilaterally. Upper chest: Lung apices are clear. Other: None. IMPRESSION: 1. Nonspecific reversal of the usual cervical lordosis, unchanged since prior study, most likely degenerative. 2. 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 3. No acute fracture or dislocation. Electronically Signed   By: Burman Nieves M.D.   On: 04/16/2023 17:31   CT Head Wo Contrast  Result Date: 04/16/2023 CLINICAL DATA:  Head trauma EXAM: CT HEAD WITHOUT CONTRAST TECHNIQUE: Contiguous axial images were obtained from the base of the skull through the vertex without intravenous contrast. RADIATION DOSE REDUCTION: This exam was performed according to the departmental dose-optimization program which includes automated exposure control, adjustment of the mA and/or kV according to patient size and/or use of iterative reconstruction technique. COMPARISON:  None Available. FINDINGS: Brain: No evidence of acute infarction, hemorrhage, hydrocephalus, extra-axial  collection or mass lesion/mass effect. Vascular: No hyperdense vessel or unexpected calcification. Skull: Normal. Negative for fracture or focal lesion. Sinuses/Orbits: No acute finding. Other: None. IMPRESSION: No acute intracranial pathology. Electronically Signed   By: Darliss Cheney M.D.   On: 04/16/2023 17:28     Data Reviewed: Relevant notes from primary care and specialist visits, past discharge summaries as available in EHR, including Care Everywhere. Prior diagnostic testing as pertinent to current admission diagnoses Updated medications and problem lists for reconciliation ED course, including vitals, labs, imaging, treatment and response to treatment Triage notes, nursing and pharmacy notes and ED provider's notes Notable results as noted in HPI   Assessment and Plan: * Closed fracture dislocation of right elbow joint, initial encounter Patient being splinted in the ED with procedural sedation Pain control N.p.o. from midnight for orthopedic repair on 6/3 by Dr. Hyacinth Meeker  Closed fracture of right pelvis, initial encounter [facility] Pain control Nonsurgical Dr. Hyacinth Meeker was consulted from the ED Further recommendations per Dr. Hyacinth Meeker  History of melanoma No acute disused suspected  Lightheadedness Remote  history of tachycardia and shortness of breath 2017 with negative cardiac workup Patient felt lightheaded prior to fall CT head with no acute findings Troponin negative and EKG nonacute Continuous cardiac monitoring and echocardiogram     DVT prophylaxis: SCD  Consults: Dr Hyacinth Meeker  Advance Care Planning: full code  Family Communication: none***  Disposition Plan: Back to previous home environment  Severity of Illness: The appropriate patient status for this patient is INPATIENT. Inpatient status is judged to be reasonable and necessary in order to provide the required intensity of service to ensure the patient's safety. The patient's presenting symptoms, physical exam  findings, and initial radiographic and laboratory data in the context of their chronic comorbidities is felt to place them at high risk for further clinical deterioration. Furthermore, it is not anticipated that the patient will be medically stable for discharge from the hospital within 2 midnights of admission.   * I certify that at the point of admission it is my clinical judgment that the patient will require inpatient hospital care spanning beyond 2 midnights from the point of admission due to high intensity of service, high risk for further deterioration and high frequency of surveillance required.*  Author: Andris Baumann, MD 04/16/2023 10:54 PM  For on call review [email or web address].

## 2023-04-16 NOTE — Assessment & Plan Note (Signed)
Patient being splinted in the ED with procedural sedation Pain control N.p.o. from midnight for orthopedic repair on 6/3 by Dr. Hyacinth Meeker

## 2023-04-17 ENCOUNTER — Encounter
Admission: EM | Disposition: A | Discharge status: Other Acute Inpatient Hospital | Payer: Self-pay | Source: Home / Self Care | Attending: Internal Medicine

## 2023-04-17 ENCOUNTER — Encounter (HOSPITAL_COMMUNITY): Payer: Self-pay

## 2023-04-17 ENCOUNTER — Inpatient Hospital Stay: Payer: Medicare Other

## 2023-04-17 ENCOUNTER — Encounter (HOSPITAL_COMMUNITY): Payer: Self-pay | Admitting: Internal Medicine

## 2023-04-17 ENCOUNTER — Other Ambulatory Visit: Payer: Self-pay

## 2023-04-17 ENCOUNTER — Inpatient Hospital Stay (HOSPITAL_COMMUNITY)
Admit: 2023-04-17 | Discharge: 2023-04-24 | DRG: 511 | Disposition: A | Discharge status: Skilled Nursing Facility | Payer: Medicare Other | Source: Other Acute Inpatient Hospital | Attending: Internal Medicine | Admitting: Internal Medicine

## 2023-04-17 DIAGNOSIS — G4733 Obstructive sleep apnea (adult) (pediatric): Secondary | ICD-10-CM

## 2023-04-17 DIAGNOSIS — E785 Hyperlipidemia, unspecified: Secondary | ICD-10-CM | POA: Diagnosis present

## 2023-04-17 DIAGNOSIS — F419 Anxiety disorder, unspecified: Secondary | ICD-10-CM | POA: Diagnosis present

## 2023-04-17 DIAGNOSIS — S329XXA Fracture of unspecified parts of lumbosacral spine and pelvis, initial encounter for closed fracture: Secondary | ICD-10-CM | POA: Diagnosis present

## 2023-04-17 DIAGNOSIS — S32111A Minimally displaced Zone I fracture of sacrum, initial encounter for closed fracture: Secondary | ICD-10-CM | POA: Diagnosis present

## 2023-04-17 DIAGNOSIS — W1789XA Other fall from one level to another, initial encounter: Secondary | ICD-10-CM | POA: Diagnosis present

## 2023-04-17 DIAGNOSIS — R42 Dizziness and giddiness: Secondary | ICD-10-CM

## 2023-04-17 DIAGNOSIS — E559 Vitamin D deficiency, unspecified: Secondary | ICD-10-CM | POA: Diagnosis present

## 2023-04-17 DIAGNOSIS — S3210XA Unspecified fracture of sacrum, initial encounter for closed fracture: Secondary | ICD-10-CM | POA: Diagnosis not present

## 2023-04-17 DIAGNOSIS — K219 Gastro-esophageal reflux disease without esophagitis: Secondary | ICD-10-CM | POA: Diagnosis present

## 2023-04-17 DIAGNOSIS — S32591A Other specified fracture of right pubis, initial encounter for closed fracture: Secondary | ICD-10-CM | POA: Diagnosis present

## 2023-04-17 DIAGNOSIS — S42401A Unspecified fracture of lower end of right humerus, initial encounter for closed fracture: Secondary | ICD-10-CM | POA: Diagnosis not present

## 2023-04-17 DIAGNOSIS — M25551 Pain in right hip: Secondary | ICD-10-CM | POA: Diagnosis not present

## 2023-04-17 DIAGNOSIS — M25521 Pain in right elbow: Secondary | ICD-10-CM | POA: Diagnosis present

## 2023-04-17 DIAGNOSIS — Z9049 Acquired absence of other specified parts of digestive tract: Secondary | ICD-10-CM

## 2023-04-17 DIAGNOSIS — Z79899 Other long term (current) drug therapy: Secondary | ICD-10-CM

## 2023-04-17 DIAGNOSIS — F32A Depression, unspecified: Secondary | ICD-10-CM | POA: Diagnosis present

## 2023-04-17 DIAGNOSIS — Z85820 Personal history of malignant melanoma of skin: Secondary | ICD-10-CM

## 2023-04-17 DIAGNOSIS — R55 Syncope and collapse: Secondary | ICD-10-CM | POA: Diagnosis present

## 2023-04-17 DIAGNOSIS — S52021A Displaced fracture of olecranon process without intraarticular extension of right ulna, initial encounter for closed fracture: Secondary | ICD-10-CM | POA: Diagnosis present

## 2023-04-17 DIAGNOSIS — E8889 Other specified metabolic disorders: Secondary | ICD-10-CM | POA: Diagnosis present

## 2023-04-17 DIAGNOSIS — Z85828 Personal history of other malignant neoplasm of skin: Secondary | ICD-10-CM | POA: Diagnosis not present

## 2023-04-17 DIAGNOSIS — Z9851 Tubal ligation status: Secondary | ICD-10-CM

## 2023-04-17 DIAGNOSIS — N3941 Urge incontinence: Secondary | ICD-10-CM | POA: Diagnosis not present

## 2023-04-17 DIAGNOSIS — Y92008 Other place in unspecified non-institutional (private) residence as the place of occurrence of the external cause: Secondary | ICD-10-CM | POA: Diagnosis not present

## 2023-04-17 DIAGNOSIS — S52031A Displaced fracture of olecranon process with intraarticular extension of right ulna, initial encounter for closed fracture: Secondary | ICD-10-CM | POA: Diagnosis not present

## 2023-04-17 DIAGNOSIS — S42401D Unspecified fracture of lower end of right humerus, subsequent encounter for fracture with routine healing: Secondary | ICD-10-CM | POA: Diagnosis not present

## 2023-04-17 LAB — D-DIMER, QUANTITATIVE: D-Dimer, Quant: 3.97 ug/mL-FEU — ABNORMAL HIGH (ref 0.00–0.50)

## 2023-04-17 SURGERY — OPEN REDUCTION INTERNAL FIXATION (ORIF) ELBOW/OLECRANON FRACTURE
Anesthesia: Choice | Site: Elbow | Laterality: Right

## 2023-04-17 SURGERY — OPEN REDUCTION INTERNAL FIXATION (ORIF) ELBOW/OLECRANON FRACTURE
Anesthesia: Choice

## 2023-04-17 MED ORDER — ACETAMINOPHEN 325 MG PO TABS: 650.0000 mg | ORAL_TABLET | Freq: Four times a day (QID) | ORAL | Status: DC | PRN

## 2023-04-17 MED ORDER — ACETAMINOPHEN 650 MG RE SUPP: 650.0000 mg | Freq: Four times a day (QID) | RECTAL | Status: DC | PRN

## 2023-04-17 MED ORDER — IOHEXOL 350 MG/ML SOLN
75.0000 mL | Freq: Once | INTRAVENOUS | Status: AC | PRN
  Administered 2023-04-17: 75 mL via INTRAVENOUS

## 2023-04-17 MED ORDER — ESCITALOPRAM OXALATE 10 MG PO TABS
10.0000 mg | ORAL_TABLET | Freq: Every day | ORAL | Status: DC
  Administered 2023-04-18 – 2023-04-24 (×7): 10 mg via ORAL
  Filled 2023-04-17 (×7): qty 1

## 2023-04-17 MED ORDER — OXYCODONE HCL 5 MG PO TABS
5.0000 mg | ORAL_TABLET | ORAL | Status: DC | PRN
  Administered 2023-04-17: 5 mg via ORAL
  Filled 2023-04-17: qty 1

## 2023-04-17 MED ORDER — HYDROMORPHONE HCL 1 MG/ML IJ SOLN
0.5000 mg | INTRAMUSCULAR | Status: DC | PRN
  Administered 2023-04-17 – 2023-04-22 (×8): 0.5 mg via INTRAVENOUS
  Filled 2023-04-17 (×8): qty 0.5

## 2023-04-17 MED ORDER — SUCRALFATE 1 G PO TABS
1.0000 g | ORAL_TABLET | Freq: Two times a day (BID) | ORAL | Status: DC
  Administered 2023-04-18 – 2023-04-24 (×13): 1 g via ORAL
  Filled 2023-04-17 (×13): qty 1

## 2023-04-17 MED ORDER — METOCLOPRAMIDE HCL 5 MG PO TABS: 10.0000 mg | ORAL_TABLET | Freq: Three times a day (TID) | ORAL | Status: DC | PRN

## 2023-04-17 MED ORDER — SENNOSIDES-DOCUSATE SODIUM 8.6-50 MG PO TABS: 1.0000 | ORAL_TABLET | Freq: Every evening | ORAL | Status: DC | PRN

## 2023-04-17 MED ORDER — BISACODYL 5 MG PO TBEC
5.0000 mg | DELAYED_RELEASE_TABLET | Freq: Every day | ORAL | Status: DC | PRN
  Administered 2023-04-19 – 2023-04-22 (×2): 5 mg via ORAL
  Filled 2023-04-17 (×2): qty 1

## 2023-04-17 MED ORDER — ENOXAPARIN SODIUM 40 MG/0.4ML IJ SOSY
40.0000 mg | PREFILLED_SYRINGE | INTRAMUSCULAR | Status: DC
  Administered 2023-04-17 – 2023-04-20 (×3): 40 mg via SUBCUTANEOUS
  Filled 2023-04-17 (×3): qty 0.4

## 2023-04-17 MED ORDER — BUSPIRONE HCL 10 MG PO TABS
10.0000 mg | ORAL_TABLET | Freq: Three times a day (TID) | ORAL | Status: DC
  Administered 2023-04-17 – 2023-04-24 (×19): 10 mg via ORAL
  Filled 2023-04-17 (×20): qty 1

## 2023-04-17 MED ORDER — ROPINIROLE HCL 0.5 MG PO TABS
3.0000 mg | ORAL_TABLET | Freq: Every day | ORAL | Status: DC
  Administered 2023-04-17 – 2023-04-23 (×7): 3 mg via ORAL
  Filled 2023-04-17 (×8): qty 6

## 2023-04-17 MED ORDER — PANTOPRAZOLE SODIUM 40 MG PO TBEC
40.0000 mg | DELAYED_RELEASE_TABLET | Freq: Two times a day (BID) | ORAL | Status: DC
  Administered 2023-04-17 – 2023-04-24 (×14): 40 mg via ORAL
  Filled 2023-04-17 (×14): qty 1

## 2023-04-17 MED ORDER — ROSUVASTATIN CALCIUM 5 MG PO TABS
10.0000 mg | ORAL_TABLET | Freq: Every day | ORAL | Status: DC
  Administered 2023-04-18 – 2023-04-24 (×7): 10 mg via ORAL
  Filled 2023-04-17 (×7): qty 2

## 2023-04-17 SURGICAL SUPPLY — 53 items
APL PRP STRL LF DISP 70% ISPRP (MISCELLANEOUS) ×1
BLADE SURG 15 STRL LF DISP TIS (BLADE) ×2 IMPLANT
BLADE SURG 15 STRL SS (BLADE) ×1
BNDG CMPR 5X4 CHSV STRCH STRL (GAUZE/BANDAGES/DRESSINGS) ×1
BNDG CMPR STD VLCR NS LF 5.8X4 (GAUZE/BANDAGES/DRESSINGS) ×2
BNDG COHESIVE 4X5 TAN STRL LF (GAUZE/BANDAGES/DRESSINGS) ×2 IMPLANT
BNDG ELASTIC 4X5.8 VLCR NS LF (GAUZE/BANDAGES/DRESSINGS) ×4 IMPLANT
BNDG ESMARCH 4 X 12 STRL LF (GAUZE/BANDAGES/DRESSINGS) ×2
BNDG ESMARCH 4X12 STRL LF (GAUZE/BANDAGES/DRESSINGS) ×4 IMPLANT
CHLORAPREP W/TINT 26 (MISCELLANEOUS) ×2 IMPLANT
COVER MAYO STAND REUSABLE (DRAPES) ×2 IMPLANT
CUFF TOURN SGL QUICK 18X4 (TOURNIQUET CUFF) ×2 IMPLANT
CUFF TOURN SGL QUICK 24 (TOURNIQUET CUFF) ×1
CUFF TRNQT CYL 24X4X16.5-23 (TOURNIQUET CUFF) ×2 IMPLANT
DRAPE 3/4 80X56 (DRAPES) ×2 IMPLANT
DRAPE C-ARM XRAY 36X54 (DRAPES) ×2 IMPLANT
DRAPE U-SHAPE 47X51 STRL (DRAPES) ×2 IMPLANT
ELECT CAUTERY BLADE 6.4 (BLADE) ×2 IMPLANT
ELECT REM PT RETURN 9FT ADLT (ELECTROSURGICAL) ×1
ELECTRODE REM PT RTRN 9FT ADLT (ELECTROSURGICAL) ×2 IMPLANT
GAUZE SPONGE 4X4 12PLY STRL (GAUZE/BANDAGES/DRESSINGS) ×2 IMPLANT
GAUZE XEROFORM 1X8 LF (GAUZE/BANDAGES/DRESSINGS) ×2 IMPLANT
GLOVE BIO SURGEON STRL SZ8 (GLOVE) ×6 IMPLANT
GLOVE BIOGEL PI IND STRL 8 (GLOVE) ×2 IMPLANT
GLOVE INDICATOR 8.0 STRL GRN (GLOVE) ×2 IMPLANT
GOWN SRG 2XL LVL 4 RGLN SLV (GOWNS) ×2 IMPLANT
GOWN STRL NON-REIN 2XL LVL4 (GOWNS) ×1
GOWN STRL REUS W/ TWL LRG LVL3 (GOWN DISPOSABLE) ×2 IMPLANT
GOWN STRL REUS W/ TWL XL LVL3 (GOWN DISPOSABLE) ×2 IMPLANT
GOWN STRL REUS W/TWL LRG LVL3 (GOWN DISPOSABLE) ×1
GOWN STRL REUS W/TWL XL LVL3 (GOWN DISPOSABLE) ×1
KIT TURNOVER KIT A (KITS) ×2 IMPLANT
MANIFOLD NEPTUNE II (INSTRUMENTS) ×2 IMPLANT
NDL FILTER BLUNT 18X1 1/2 (NEEDLE) ×1 IMPLANT
NEEDLE FILTER BLUNT 18X1 1/2 (NEEDLE) ×1 IMPLANT
NS IRRIG 500ML POUR BTL (IV SOLUTION) ×2 IMPLANT
PACK EXTREMITY ARMC (MISCELLANEOUS) ×2 IMPLANT
PAD ABD DERMACEA PRESS 5X9 (GAUZE/BANDAGES/DRESSINGS) ×4 IMPLANT
PAD PREP OB/GYN DISP 24X41 (PERSONAL CARE ITEMS) ×2 IMPLANT
PADDING CAST BLEND 4X4 STRL (MISCELLANEOUS) ×4 IMPLANT
SPLINT CAST 1 STEP 5X30 WHT (MISCELLANEOUS) ×2 IMPLANT
SPONGE T-LAP 18X18 ~~LOC~~+RFID (SPONGE) ×2 IMPLANT
STAPLER SKIN PROX 35W (STAPLE) ×2 IMPLANT
STOCKINETTE IMPERVIOUS 9X36 MD (GAUZE/BANDAGES/DRESSINGS) ×2 IMPLANT
STRIP CLOSURE SKIN 1/2X4 (GAUZE/BANDAGES/DRESSINGS) ×2 IMPLANT
SUT ETHILON 3-0 FS-10 30 BLK (SUTURE) ×1
SUT VIC AB 0 CT2 27 (SUTURE) ×2 IMPLANT
SUT VIC AB 2-0 CT2 27 (SUTURE) ×2 IMPLANT
SUTURE EHLN 3-0 FS-10 30 BLK (SUTURE) ×2 IMPLANT
SYR 5ML LL (SYRINGE) ×2 IMPLANT
TAPE TRANSPORE STRL 2 31045 (GAUZE/BANDAGES/DRESSINGS) ×2 IMPLANT
TRAP FLUID SMOKE EVACUATOR (MISCELLANEOUS) ×2 IMPLANT
WATER STERILE IRR 500ML POUR (IV SOLUTION) ×2 IMPLANT

## 2023-04-17 NOTE — Hospital Course (Addendum)
Taken from H&P.  Christine Arias is a 67 y.o. female with medical history significant for  tachycardia and shortness of breath of uncertain etiology, previously followed by cardiologist, Dr. Lady Gary with unremarkable Holter, echo and nuclear stress test in 2017, with no recent studies, who presentsThe ED following a fall onto her right side which was preceded by lightheadedness.  She had immediate pain to the right elbow and right hip.  Patient recently had a trip to [facility].  ED course and data review: Vitals within normal limits.  Troponin 5, mild anemia of 11.9 with otherwise normal labs. EKG, showing NSR at 73 with no concerning ST-T wave changes. Extensive trauma imaging significant for right elbow fracture and right pelvis fracture. CT head and C-spine negative, right shoulder x-ray without injury and chest x-ray was nonacute. The ED provider spoke with orthopedist Dr. Hyacinth Meeker who will take patient to the OR at 3 PM on 6/3.  6/3: Vitals normal.  D-dimer elevated at 3.97, likely due to fracture.  CTA was negative for PE, did show areas of heterogeneous groundglass within the dependent lungs, may be hypoventilatory changes however atypical infection is also considered.  Shotty bilateral hilar nodes are likely reactive. Lower extremity venous Doppler was also negative for any DVT.  Patient was supposed to go to the OR with orthopedic surgery.  They just informed that she need to go to [facility] so trauma team can evaluate and do her surgery.  Dr. Carola Frost will take care of her orthopedic needs over there.  [facility] was contacted for transfer.  Patient will be going over there under hospitalist service, need a consult with trauma and Dr. Carola Frost on arrival.

## 2023-04-17 NOTE — Consult Note (Signed)
ORTHOPAEDIC CONSULTATION  REQUESTING PHYSICIAN: Arnetha Courser, MD  Chief Complaint: Right elbow and pelvis pain  HPI: Christine Arias is a 67 y.o. female who complains of right elbow and pelvis pain.  Patient fell going into her home yesterday evening and injured her right elbow and pelvis.  She was taken to the emergency room at [facility] where exam and x-rays revealed a comminuted intra-articular somewhat displaced fracture of the right proximal ulna.  In addition she had nondisplaced fractures of the superior and inferior pubic rami.  The patient been admitted for the pelvic fractures and surgical treatment of the elbow.  In reviewing the x-rays of decided that this is outside my scope of practice and have arranged for her to be transferred to [facility] to the care of Dr. Myrene Galas the trauma specialist.  He has agreed to be do her surgery tomorrow at [facility].  Will arrange for transfer to [facility] today.  The patient was advised of these findings and agrees to the plan of treatment.  I explained to her that I felt that she would more likely get a good postoperative result in his hands than mine.  Past Medical History:  Diagnosis Date   Allergy    seasonal   Diffuse cystic mastopathy    History of diverticulitis of colon 2003   Melanoma (HCC) 2013   T1b N1 excised from left hip region-024mm deep, sn positive. Subsequently had left inguinal node dissection and interferon therapy   Personal history of other malignant neoplasm of skin 2014   Special screening for malignant neoplasms, colon    Tibial plateau fracture, left    no surgery. 2020   Past Surgical History:  Procedure Laterality Date   APPENDECTOMY     BREAST BIOPSY Left    neg core 2014   BREAST CYST ASPIRATION Bilateral 2001   CESAREAN SECTION     COLON SURGERY  2009   colectomy-sigmoid, hand assist   COLONOSCOPY  2008, 2015   Dr. Evette Cristal   INGUINAL LYMPH NODE BIOPSY Left 2013   left inguinal node  dissection,positve sn d/t melanoma left hip region.   LAPAROSCOPY  2009   MELANOMA EXCISION Left 2013   excised from hip region-0.16mm deep, sentinel node positive. Subsequently had left inquinal node dissection and interferon therapy   TONSILLECTOMY AND ADENOIDECTOMY     TUBAL LIGATION     Social History   Socioeconomic History   Marital status: Married    Spouse name: Not on file   Number of children: Not on file   Years of education: Not on file   Highest education level: Not on file  Occupational History   Not on file  Tobacco Use   Smoking status: Never   Smokeless tobacco: Never  Vaping Use   Vaping Use: Never used  Substance and Sexual Activity   Alcohol use: Yes    Alcohol/week: 0.0 standard drinks of alcohol    Comment: ocassionally   Drug use: No   Sexual activity: Not on file  Other Topics Concern   Not on file  Social History Narrative   Not on file   Social Determinants of Health   Financial Resource Strain: Not on file  Food Insecurity: No Food Insecurity (04/17/2023)   Hunger Vital Sign    Worried About Running Out of Food in the Last Year: Never true    Ran Out of Food in the Last Year: Never true  Transportation Needs: No Transportation Needs (  04/17/2023)   PRAPARE - Administrator, Civil Service (Medical): No    Lack of Transportation (Non-Medical): No  Physical Activity: Not on file  Stress: Not on file  Social Connections: Not on file   Family History  Problem Relation Age of Onset   Cancer Father        cancer of throat, cancer of lung   Colon polyps Mother    Breast cancer Maternal Aunt 38   Breast cancer Maternal Aunt 50   Breast cancer Maternal Aunt 60   No Known Allergies Prior to Admission medications   Medication Sig Start Date End Date Taking? Authorizing Provider  Biotin 1000 MCG tablet Take 1,000 mcg by mouth daily.   Yes [provider]  busPIRone (BUSPAR) 10 MG tablet Take 10 mg by mouth 3 (three) times  daily.   Yes [provider]  calcium carbonate (OS-CAL) 600 MG TABS tablet Take 600 mg by mouth daily with breakfast.   Yes [provider]  escitalopram (LEXAPRO) 10 MG tablet Take 10 mg by mouth daily.   Yes [provider]  metoCLOPramide (REGLAN) 10 MG tablet Take 10 mg by mouth 3 (three) times daily as needed. 03/29/23  Yes [provider]  Misc Natural Products (GLUCOSAMINE CHONDROITIN ADV PO) Take by mouth daily.   Yes [provider]  pantoprazole (PROTONIX) 40 MG tablet Take 40 mg by mouth 2 (two) times daily. 09/07/21  Yes [provider]  rOPINIRole (REQUIP) 3 MG tablet Take 3 mg by mouth at bedtime.   Yes [provider]  rosuvastatin (CRESTOR) 10 MG tablet Take 10 mg by mouth daily.   Yes [provider]  sucralfate (CARAFATE) 1 g tablet Take 1 g by mouth 2 (two) times daily. 03/10/23  Yes [provider]   US Venous Img Lower Bilateral (DVT)  Result Date: 04/17/2023 CLINICAL DATA:  Fall, trauma, elevated D-dimer EXAM: BILATERAL LOWER EXTREMITY VENOUS DOPPLER ULTRASOUND TECHNIQUE: Gray-scale sonography with graded compression, as well as color Doppler and duplex ultrasound were performed to evaluate the lower extremity deep venous systems from the level of the common femoral vein and including the common femoral, femoral, profunda femoral, popliteal and calf veins including the posterior tibial, peroneal and gastrocnemius veins when visible. Spectral Doppler was utilized to evaluate flow at rest and with distal augmentation maneuvers in the common femoral, femoral and popliteal veins. COMPARISON:  None Available. FINDINGS: RIGHT LOWER EXTREMITY Common Femoral Vein: No evidence of thrombus. Normal compressibility, respiratory phasicity and response to augmentation. Saphenofemoral Junction: No evidence of thrombus. Normal compressibility and flow on color Doppler imaging. Profunda Femoral Vein: No evidence of  thrombus. Normal compressibility and flow on color Doppler imaging. Femoral Vein: No evidence of thrombus. Normal compressibility, respiratory phasicity and response to augmentation. Popliteal Vein: No evidence of thrombus. Normal compressibility, respiratory phasicity and response to augmentation. Calf Veins: No evidence of thrombus. Normal compressibility and flow on color Doppler imaging. LEFT LOWER EXTREMITY Common Femoral Vein: No evidence of thrombus. Normal compressibility, respiratory phasicity and response to augmentation. Saphenofemoral Junction: No evidence of thrombus. Normal compressibility and flow on color Doppler imaging. Profunda Femoral Vein: No evidence of thrombus. Normal compressibility and flow on color Doppler imaging. Femoral Vein: No evidence of thrombus. Normal compressibility, respiratory phasicity and response to augmentation. Popliteal Vein: No evidence of thrombus. Normal compressibility, respiratory phasicity and response to augmentation. Calf Veins: No evidence of thrombus. Normal compressibility and flow on color Doppler imaging. IMPRESSION: No  evidence of deep venous thrombosis in either lower extremity. Electronically Signed   By: Judie Petit.  Shick M.D.   On: 04/17/2023 10:41   CT Angio Chest Pulmonary Embolism (PE) W or WO Contrast  Result Date: 04/17/2023 CLINICAL DATA:  Syncope, simple, normal neuro exam EXAM: CT ANGIOGRAPHY CHEST WITH CONTRAST TECHNIQUE: Multidetector CT imaging of the chest was performed using the standard protocol during bolus administration of intravenous contrast. Multiplanar CT image reconstructions and MIPs were obtained to evaluate the vascular anatomy. RADIATION DOSE REDUCTION: This exam was performed according to the departmental dose-optimization program which includes automated exposure control, adjustment of the mA and/or kV according to patient size and/or use of iterative reconstruction technique. CONTRAST:  75mL OMNIPAQUE IOHEXOL 350 MG/ML SOLN  COMPARISON:  Radiograph yesterday. FINDINGS: Cardiovascular: There are no filling defects within the pulmonary arteries to suggest pulmonary embolus. No aortic dissection or aneurysm. The heart is borderline enlarged. No pericardial effusion. Mediastinum/Nodes: Shotty 11 mm right hilar node and 10 mm left hilar node. There is no mediastinal adenopathy. Minimal hiatal hernia. Lungs/Pleura: Areas of heterogeneous ground-glass within the dependent lungs most prominent in the lower lobes. No confluent consolidation. No pleural fluid. Trachea and central airways are clear. Upper Abdomen: Subcentimeter hepatic hypodensities are too small to characterize, likely small cyst. No further imaging follow-up is needed. No acute upper abdominal findings. Musculoskeletal: Small bone island within T8. There are no acute or suspicious osseous abnormalities. Review of the MIP images confirms the above findings. IMPRESSION: 1. No pulmonary embolus. 2. Areas of heterogeneous ground-glass within the dependent lungs most prominent in the lower lobes, may be hypoventilatory changes, however atypical infection is also considered. 3. Shotty bilateral hilar nodes are likely reactive. 4. Minimal hiatal hernia. Electronically Signed   By: Narda Rutherford M.D.   On: 04/17/2023 03:19   CT PELVIS WO CONTRAST  Result Date: 04/16/2023 CLINICAL DATA:  Pelvic fracture EXAM: CT PELVIS WITHOUT CONTRAST TECHNIQUE: Multidetector CT imaging of the pelvis was performed following the standard protocol without intravenous contrast. RADIATION DOSE REDUCTION: This exam was performed according to the departmental dose-optimization program which includes automated exposure control, adjustment of the mA and/or kV according to patient size and/or use of iterative reconstruction technique. COMPARISON:  CT 04/28/2016 FINDINGS: Urinary Tract:  Urinary bladder physiologically distended. Bowel: Anastomotic staple line in the proximal sigmoid colon. Visualized small  bowel and colon are nondistended. Vascular/Lymphatic: No abdominal or pelvic adenopathy. Surgical clips about the left common femoral vessels. Reproductive:  No mass or other significant abnormality Other:  No pelvic ascites.  No free air. Musculoskeletal: Right sacral ala fracture, minimally displaced, with small adjacent anterior pelvic hematoma. Superior right pubic ramus fracture, minimally displaced. Inferior right ischiopubic ramus fracture, minimally displaced. IMPRESSION: Minimally displaced right sacral ala, right superior pubic ramus, and right inferior ischiopubic ramus fractures. Electronically Signed   By: Corlis Leak M.D.   On: 04/16/2023 21:48   CT Elbow Right Wo Contrast  Result Date: 04/16/2023 CLINICAL DATA:  Elbow trauma EXAM: CT OF THE UPPER RIGHT EXTREMITY WITHOUT CONTRAST TECHNIQUE: Multidetector CT imaging of the upper right extremity was performed according to the standard protocol. RADIATION DOSE REDUCTION: This exam was performed according to the departmental dose-optimization program which includes automated exposure control, adjustment of the mA and/or kV according to patient size and/or use of iterative reconstruction technique. COMPARISON:  Right elbow x-ray same day FINDINGS: Bones/Joint/Cartilage There is a comminuted fracture of the ulna. Fracture fragments are distracted up to 2 cm at the  level of the distal humerus. There is intra-articular extension and inferior displacement of the distal humerus as it articulates with the ulna (1 cm). There is also anterior radial head dislocation/subluxation. No joint effusion identified. Ligaments Suboptimally assessed by CT. Muscles and Tendons Grossly within normal limits on this noncontrast study. Soft tissues There is posterior subcutaneous edema and hemorrhage of the elbow. IMPRESSION: 1. Comminuted displaced fracture of the distal humerus with intra-articular extension. 2. Anterior radial head dislocation/subluxation. Electronically  Signed   By: Darliss Cheney M.D.   On: 04/16/2023 21:46   DG Shoulder Right  Result Date: 04/16/2023 CLINICAL DATA:  Larey Seat 30 minutes ago.  Right shoulder pain. EXAM: RIGHT SHOULDER - 2+ VIEW COMPARISON:  None Available. FINDINGS: There is no evidence of fracture or dislocation. There is no evidence of arthropathy or other focal bone abnormality. Soft tissues are unremarkable. IMPRESSION: Negative. Electronically Signed   By: Burman Nieves M.D.   On: 04/16/2023 18:05   DG Hip Unilat W or Wo Pelvis 2-3 Views Right  Result Date: 04/16/2023 CLINICAL DATA:  Pain 30 minutes ago with right hip and groin pain. EXAM: DG HIP (WITH OR WITHOUT PELVIS) 2-3V RIGHT COMPARISON:  None Available. FINDINGS: No evidence of acute fracture or dislocation of the right hip. Nondisplaced fractures are demonstrated in the superior and inferior pubic rami. SI joints and symphysis pubis are not displaced. Mild degenerative changes in the right hip. Surgical clips in the left groin region. IMPRESSION: Nondisplaced fractures of the right superior and inferior pubic rami. Right hip appears otherwise intact. Electronically Signed   By: Burman Nieves M.D.   On: 04/16/2023 18:04   DG Elbow Complete Right  Result Date: 04/16/2023 CLINICAL DATA:  Larey Seat about 30 minutes ago with right elbow pain and hematoma. EXAM: RIGHT ELBOW - COMPLETE 3+ VIEW COMPARISON:  None Available. FINDINGS: Multiple comminuted fractures of the right olecranon with displacement of fracture fragments. Fracture lines extend to the articular surface of the ulnar trochlear joint. The radiocapitellar joint appears intact. Associated right elbow effusion and prominent soft tissue swelling. IMPRESSION: Comminuted intra-articular and displaced fractures of the right olecranon. Associated soft tissue swelling and right elbow effusion. Electronically Signed   By: Burman Nieves M.D.   On: 04/16/2023 18:03   DG Chest 2 View  Result Date: 04/16/2023 CLINICAL DATA:  Fall  about 30 minutes ago. EXAM: CHEST - 2 VIEW COMPARISON:  06/13/2017 FINDINGS: The heart size and mediastinal contours are within normal limits. Both lungs are clear. The visualized skeletal structures are unremarkable. IMPRESSION: No active cardiopulmonary disease. Electronically Signed   By: Burman Nieves M.D.   On: 04/16/2023 18:01   CT Cervical Spine Wo Contrast  Result Date: 04/16/2023 CLINICAL DATA:  Neck trauma EXAM: CT CERVICAL SPINE WITHOUT CONTRAST TECHNIQUE: Multidetector CT imaging of the cervical spine was performed without intravenous contrast. Multiplanar CT image reconstructions were also generated. RADIATION DOSE REDUCTION: This exam was performed according to the departmental dose-optimization program which includes automated exposure control, adjustment of the mA and/or kV according to patient size and/or use of iterative reconstruction technique. COMPARISON:  Cervical spine radiographs 06/04/2010 FINDINGS: Alignment: Reversal of the usual cervical lordosis without anterior subluxation. Alignment is similar to prior study and likely reflects degenerative change. Muscle spasm could also have this appearance. Skull base and vertebrae: Skull base appears intact. No vertebral compression deformities. No focal bone lesion or bone destruction. Soft tissues and spinal canal: No prevertebral soft tissue swelling. No abnormal paraspinal soft tissue  mass or infiltration. Disc levels: Degenerative changes with narrowed disc spaces and endplate osteophyte formation most prominent at C3-4, C4-5, and C5-6 levels. Since the previous study, there is progressive loss of disc height at C3-4 with prominent osteophyte formation and partial degenerative coalition. Degenerative changes in the posterior facet joints. Uncovertebral and facet joint spurring causes some bone encroachment upon neural foramina bilaterally. Upper chest: Lung apices are clear. Other: None. IMPRESSION: 1. Nonspecific reversal of the usual  cervical lordosis, unchanged since prior study, most likely degenerative. 2. 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 3. No acute fracture or dislocation. Electronically Signed   By: Burman Nieves M.D.   On: 04/16/2023 17:31   CT Head Wo Contrast  Result Date: 04/16/2023 CLINICAL DATA:  Head trauma EXAM: CT HEAD WITHOUT CONTRAST TECHNIQUE: Contiguous axial images were obtained from the base of the skull through the vertex without intravenous contrast. RADIATION DOSE REDUCTION: This exam was performed according to the departmental dose-optimization program which includes automated exposure control, adjustment of the mA and/or kV according to patient size and/or use of iterative reconstruction technique. COMPARISON:  None Available. FINDINGS: Brain: No evidence of acute infarction, hemorrhage, hydrocephalus, extra-axial collection or mass lesion/mass effect. Vascular: No hyperdense vessel or unexpected calcification. Skull: Normal. Negative for fracture or focal lesion. Sinuses/Orbits: No acute finding. Other: None. IMPRESSION: No acute intracranial pathology. Electronically Signed   By: Darliss Cheney M.D.   On: 04/16/2023 17:28    Positive ROS: All other systems have been reviewed and were otherwise negative with the exception of those mentioned in the HPI and as above.  Physical Exam: General: Alert, no acute distress Cardiovascular: No pedal edema Respiratory: No cyanosis, no use of accessory musculature GI: No organomegaly, abdomen is soft and non-tender Skin: No lesions in the area of chief complaint Neurologic: Sensation intact distally Psychiatric: Patient is competent for consent with normal mood and affect Lymphatic: No axillary or cervical lymphadenopathy  MUSCULOSKELETAL: Patient alert awake and cooperative.  The right arm is in a posterior splint.  Neurovascular status good distally.  Minimal swelling distally.  Patient has minimal tenderness over the  anterior pelvis.  Range of motion of the hip is painful.  Neurovascular status is good.    Assessment: Comminuted and displaced intra-articular fracture right proximal ulna Nondisplaced right pubic rami fractures  Plan: The patient will be transferred to [facility] today.  Dr. Myrene Galas has agreed to take over her orthopedic care and will operate tomorrow.    Valinda Hoar, MD [phone]   04/17/2023 1:11 PM

## 2023-04-17 NOTE — Assessment & Plan Note (Signed)
Cpap qhs  

## 2023-04-17 NOTE — H&P (Signed)
History and Physical    Christine Arias:[id number] DOB: 14-Jul-1956 DOA: 04/17/2023  PCP: System, Provider Not In (Confirm with patient/family/NH records and if not entered, this has to be entered at [facility] point of entry) Patient coming from: [facility]  I have personally briefly reviewed patient's old medical records in [facility] Link  Chief Complaint: I fell and broke my elbow  HPI: Christine Arias is a 67 y.o. female with medical history significant of anxiety/depression, GERD, remote history of tachycardia in 2017, presented with presyncope and fall and double and pelvis pain.  Patient fell yesterday after a driving 3 hours trip from [place] to [place] area.  She described lightheadedness before the episode, but no LOC.  She fell on her right elbow and right side of the hip.  Excruciating pain after the fall on right elbow and pelvis.  Denies any numbness weakness of any of the limbs, denies any palpitations, nauseous vomiting chest pain before or during the episode.  Reported taking no new medications  ED Course: Afebrile, none tachycardia none hypotension nonhypoxic.  CT right elbow showed distal humerus fracture with anterior radial head dislocation/subluxation, and minimally depressed latest right sacral ala, right superior pubic ramus and right inferior ischial pubic ramus fracture Blood work showed K3.5, creatinine 0.7, WBC 8.7 Trauma surgeon at [facility] contacted, patient transfer to [facility] to manage right elbow fracture.  D-dimer> 3 and CTA negative for PE, DVT study negative as well.  Review of Systems: As per HPI otherwise 14 point review of systems negative.    Past Medical History:  Diagnosis Date   Allergy    seasonal   Diffuse cystic mastopathy    History of diverticulitis of colon 2003   Melanoma (HCC) 2013   T1b N1 excised from left hip region-045mm deep, sn positive. Subsequently had left inguinal node dissection and interferon therapy   Personal history of other malignant  neoplasm of skin 2014   Special screening for malignant neoplasms, colon    Tibial plateau fracture, left    no surgery. 2020    Past Surgical History:  Procedure Laterality Date   APPENDECTOMY     BREAST BIOPSY Left    neg core 2014   BREAST CYST ASPIRATION Bilateral 2001   CESAREAN SECTION     COLON SURGERY  2009   colectomy-sigmoid, hand assist   COLONOSCOPY  2008, 2015   Dr. Evette Cristal   INGUINAL LYMPH NODE BIOPSY Left 2013   left inguinal node dissection,positve sn d/t melanoma left hip region.   LAPAROSCOPY  2009   MELANOMA EXCISION Left 2013   excised from hip region-0.75mm deep, sentinel node positive. Subsequently had left inquinal node dissection and interferon therapy   TONSILLECTOMY AND ADENOIDECTOMY     TUBAL LIGATION       reports that she has never smoked. She has never used smokeless tobacco. She reports current alcohol use. She reports that she does not use drugs.  No Known Allergies  Family History  Problem Relation Age of Onset   Cancer Father        cancer of throat, cancer of lung   Colon polyps Mother    Breast cancer Maternal Aunt 12   Breast cancer Maternal Aunt 24   Breast cancer Maternal Aunt 60     Prior to Admission medications   Medication Sig Start Date End Date Taking? Authorizing Provider  Biotin 1000 MCG tablet Take 1,000 mcg by mouth daily.    [provider]  busPIRone (  BUSPAR) 10 MG tablet Take 10 mg by mouth 3 (three) times daily.    [provider]  calcium carbonate (OS-CAL) 600 MG TABS tablet Take 600 mg by mouth daily with breakfast.    [provider]  escitalopram (LEXAPRO) 10 MG tablet Take 10 mg by mouth daily.    [provider]  metoCLOPramide (REGLAN) 10 MG tablet Take 10 mg by mouth 3 (three) times daily as needed. 03/29/23   [provider]  Misc Natural Products (GLUCOSAMINE CHONDROITIN ADV PO) Take by mouth daily.    [provider]  pantoprazole (PROTONIX) 40 MG  tablet Take 40 mg by mouth 2 (two) times daily. 09/07/21   [provider]  rOPINIRole (REQUIP) 3 MG tablet Take 3 mg by mouth at bedtime.    [provider]  rosuvastatin (CRESTOR) 10 MG tablet Take 10 mg by mouth daily.    [provider]  sucralfate (CARAFATE) 1 g tablet Take 1 g by mouth 2 (two) times daily. 03/10/23   [provider]    Physical Exam: Vitals:   04/17/23 1700  Weight: 81.6 kg  Height: 5\' 3"  (1.6 m)    Constitutional: NAD, calm, comfortable Vitals:   04/17/23 1700  Weight: 81.6 kg  Height: 5\' 3"  (1.6 m)   Eyes: PERRL, lids and conjunctivae normal ENMT: Mucous membranes are moist. Posterior pharynx clear of any exudate or lesions.Normal dentition.  Neck: normal, supple, no masses, no thyromegaly Respiratory: clear to auscultation bilaterally, no wheezing, no crackles. Normal respiratory effort. No accessory muscle use.  Cardiovascular: Regular rate and rhythm, no murmurs / rubs / gallops. No extremity edema. 2+ pedal pulses. No carotid bruits.  Abdomen: no tenderness, no masses palpated. No hepatosplenomegaly. Bowel sounds positive.  Musculoskeletal: Right elbow in a sling, moving all 5 fingers on right side without problems, denies any numbness of right forearm or hand or fingers, capillary refill brisk on right 5 fingers. Skin: no rashes, lesions, ulcers. No induration Neurologic: CN 2-12 grossly intact. Sensation intact, DTR normal. Strength 5/5 in all 4.  Psychiatric: Normal judgment and insight. Alert and oriented x 3. Normal mood.    Labs on Admission: I have personally reviewed following labs and imaging studies  CBC: Recent Labs  Lab 04/16/23 1655  WBC 8.7  HGB 11.9*  HCT 36.3  MCV 94.8  PLT 225   Basic Metabolic Panel: Recent Labs  Lab 04/16/23 1655  NA 140  K 3.5  CL 105  CO2 27  GLUCOSE 116*  BUN 20  CREATININE 0.73  CALCIUM 9.2   GFR: Estimated Creatinine Clearance: 67.2 mL/min (by C-G formula  based on SCr of 0.73 mg/dL). Liver Function Tests: No results for input(s): "AST", "ALT", "ALKPHOS", "BILITOT", "PROT", "ALBUMIN" in the last 168 hours. No results for input(s): "LIPASE", "AMYLASE" in the last 168 hours. No results for input(s): "AMMONIA" in the last 168 hours. Coagulation Profile: No results for input(s): "INR", "PROTIME" in the last 168 hours. Cardiac Enzymes: No results for input(s): "CKTOTAL", "CKMB", "CKMBINDEX", "TROPONINI" in the last 168 hours. BNP (last 3 results) No results for input(s): "PROBNP" in the last 8760 hours. HbA1C: No results for input(s): "HGBA1C" in the last 72 hours. CBG: No results for input(s): "GLUCAP" in the last 168 hours. Lipid Profile: No results for input(s): "CHOL", "HDL", "LDLCALC", "TRIG", "CHOLHDL", "LDLDIRECT" in the last 72 hours. Thyroid Function Tests: No results for input(s): "TSH", "T4TOTAL", "FREET4", "T3FREE", "THYROIDAB" in the last 72 hours. Anemia Panel: No results  for input(s): "VITAMINB12", "FOLATE", "FERRITIN", "TIBC", "IRON", "RETICCTPCT" in the last 72 hours. Urine analysis:    Component Value Date/Time   APPEARANCEUR Clear 05/31/2016 1653   GLUCOSEU Negative 05/31/2016 1653   BILIRUBINUR Negative 05/31/2016 1653   PROTEINUR Negative 05/31/2016 1653   NITRITE Negative 05/31/2016 1653   LEUKOCYTESUR Negative 05/31/2016 1653    Radiological Exams on Admission: US Venous Img Lower Bilateral (DVT)  Result Date: 04/17/2023 CLINICAL DATA:  Fall, trauma, elevated D-dimer EXAM: BILATERAL LOWER EXTREMITY VENOUS DOPPLER ULTRASOUND TECHNIQUE: Gray-scale sonography with graded compression, as well as color Doppler and duplex ultrasound were performed to evaluate the lower extremity deep venous systems from the level of the common femoral vein and including the common femoral, femoral, profunda femoral, popliteal and calf veins including the posterior tibial, peroneal and gastrocnemius veins when visible. Spectral Doppler was  utilized to evaluate flow at rest and with distal augmentation maneuvers in the common femoral, femoral and popliteal veins. COMPARISON:  None Available. FINDINGS: RIGHT LOWER EXTREMITY Common Femoral Vein: No evidence of thrombus. Normal compressibility, respiratory phasicity and response to augmentation. Saphenofemoral Junction: No evidence of thrombus. Normal compressibility and flow on color Doppler imaging. Profunda Femoral Vein: No evidence of thrombus. Normal compressibility and flow on color Doppler imaging. Femoral Vein: No evidence of thrombus. Normal compressibility, respiratory phasicity and response to augmentation. Popliteal Vein: No evidence of thrombus. Normal compressibility, respiratory phasicity and response to augmentation. Calf Veins: No evidence of thrombus. Normal compressibility and flow on color Doppler imaging. LEFT LOWER EXTREMITY Common Femoral Vein: No evidence of thrombus. Normal compressibility, respiratory phasicity and response to augmentation. Saphenofemoral Junction: No evidence of thrombus. Normal compressibility and flow on color Doppler imaging. Profunda Femoral Vein: No evidence of thrombus. Normal compressibility and flow on color Doppler imaging. Femoral Vein: No evidence of thrombus. Normal compressibility, respiratory phasicity and response to augmentation. Popliteal Vein: No evidence of thrombus. Normal compressibility, respiratory phasicity and response to augmentation. Calf Veins: No evidence of thrombus. Normal compressibility and flow on color Doppler imaging. IMPRESSION: No evidence of deep venous thrombosis in either lower extremity. Electronically Signed   By: Judie Petit.  Shick M.D.   On: 04/17/2023 10:41   CT Angio Chest Pulmonary Embolism (PE) W or WO Contrast  Result Date: 04/17/2023 CLINICAL DATA:  Syncope, simple, normal neuro exam EXAM: CT ANGIOGRAPHY CHEST WITH CONTRAST TECHNIQUE: Multidetector CT imaging of the chest was performed using the standard protocol  during bolus administration of intravenous contrast. Multiplanar CT image reconstructions and MIPs were obtained to evaluate the vascular anatomy. RADIATION DOSE REDUCTION: This exam was performed according to the departmental dose-optimization program which includes automated exposure control, adjustment of the mA and/or kV according to patient size and/or use of iterative reconstruction technique. CONTRAST:  75mL OMNIPAQUE IOHEXOL 350 MG/ML SOLN COMPARISON:  Radiograph yesterday. FINDINGS: Cardiovascular: There are no filling defects within the pulmonary arteries to suggest pulmonary embolus. No aortic dissection or aneurysm. The heart is borderline enlarged. No pericardial effusion. Mediastinum/Nodes: Shotty 11 mm right hilar node and 10 mm left hilar node. There is no mediastinal adenopathy. Minimal hiatal hernia. Lungs/Pleura: Areas of heterogeneous ground-glass within the dependent lungs most prominent in the lower lobes. No confluent consolidation. No pleural fluid. Trachea and central airways are clear. Upper Abdomen: Subcentimeter hepatic hypodensities are too small to characterize, likely small cyst. No further imaging follow-up is needed. No acute upper abdominal findings. Musculoskeletal: Small bone island within T8. There are no acute or suspicious osseous abnormalities. Review of the MIP  images confirms the above findings. IMPRESSION: 1. No pulmonary embolus. 2. Areas of heterogeneous ground-glass within the dependent lungs most prominent in the lower lobes, may be hypoventilatory changes, however atypical infection is also considered. 3. Shotty bilateral hilar nodes are likely reactive. 4. Minimal hiatal hernia. Electronically Signed   By: Narda Rutherford M.D.   On: 04/17/2023 03:19   CT PELVIS WO CONTRAST  Result Date: 04/16/2023 CLINICAL DATA:  Pelvic fracture EXAM: CT PELVIS WITHOUT CONTRAST TECHNIQUE: Multidetector CT imaging of the pelvis was performed following the standard protocol without  intravenous contrast. RADIATION DOSE REDUCTION: This exam was performed according to the departmental dose-optimization program which includes automated exposure control, adjustment of the mA and/or kV according to patient size and/or use of iterative reconstruction technique. COMPARISON:  CT 04/28/2016 FINDINGS: Urinary Tract:  Urinary bladder physiologically distended. Bowel: Anastomotic staple line in the proximal sigmoid colon. Visualized small bowel and colon are nondistended. Vascular/Lymphatic: No abdominal or pelvic adenopathy. Surgical clips about the left common femoral vessels. Reproductive:  No mass or other significant abnormality Other:  No pelvic ascites.  No free air. Musculoskeletal: Right sacral ala fracture, minimally displaced, with small adjacent anterior pelvic hematoma. Superior right pubic ramus fracture, minimally displaced. Inferior right ischiopubic ramus fracture, minimally displaced. IMPRESSION: Minimally displaced right sacral ala, right superior pubic ramus, and right inferior ischiopubic ramus fractures. Electronically Signed   By: Corlis Leak M.D.   On: 04/16/2023 21:48   CT Elbow Right Wo Contrast  Result Date: 04/16/2023 CLINICAL DATA:  Elbow trauma EXAM: CT OF THE UPPER RIGHT EXTREMITY WITHOUT CONTRAST TECHNIQUE: Multidetector CT imaging of the upper right extremity was performed according to the standard protocol. RADIATION DOSE REDUCTION: This exam was performed according to the departmental dose-optimization program which includes automated exposure control, adjustment of the mA and/or kV according to patient size and/or use of iterative reconstruction technique. COMPARISON:  Right elbow x-ray same day FINDINGS: Bones/Joint/Cartilage There is a comminuted fracture of the ulna. Fracture fragments are distracted up to 2 cm at the level of the distal humerus. There is intra-articular extension and inferior displacement of the distal humerus as it articulates with the ulna (1  cm). There is also anterior radial head dislocation/subluxation. No joint effusion identified. Ligaments Suboptimally assessed by CT. Muscles and Tendons Grossly within normal limits on this noncontrast study. Soft tissues There is posterior subcutaneous edema and hemorrhage of the elbow. IMPRESSION: 1. Comminuted displaced fracture of the distal humerus with intra-articular extension. 2. Anterior radial head dislocation/subluxation. Electronically Signed   By: Darliss Cheney M.D.   On: 04/16/2023 21:46   DG Shoulder Right  Result Date: 04/16/2023 CLINICAL DATA:  Larey Seat 30 minutes ago.  Right shoulder pain. EXAM: RIGHT SHOULDER - 2+ VIEW COMPARISON:  None Available. FINDINGS: There is no evidence of fracture or dislocation. There is no evidence of arthropathy or other focal bone abnormality. Soft tissues are unremarkable. IMPRESSION: Negative. Electronically Signed   By: Burman Nieves M.D.   On: 04/16/2023 18:05   DG Hip Unilat W or Wo Pelvis 2-3 Views Right  Result Date: 04/16/2023 CLINICAL DATA:  Pain 30 minutes ago with right hip and groin pain. EXAM: DG HIP (WITH OR WITHOUT PELVIS) 2-3V RIGHT COMPARISON:  None Available. FINDINGS: No evidence of acute fracture or dislocation of the right hip. Nondisplaced fractures are demonstrated in the superior and inferior pubic rami. SI joints and symphysis pubis are not displaced. Mild degenerative changes in the right hip. Surgical clips in the left  groin region. IMPRESSION: Nondisplaced fractures of the right superior and inferior pubic rami. Right hip appears otherwise intact. Electronically Signed   By: Burman Nieves M.D.   On: 04/16/2023 18:04   DG Elbow Complete Right  Result Date: 04/16/2023 CLINICAL DATA:  Larey Seat about 30 minutes ago with right elbow pain and hematoma. EXAM: RIGHT ELBOW - COMPLETE 3+ VIEW COMPARISON:  None Available. FINDINGS: Multiple comminuted fractures of the right olecranon with displacement of fracture fragments. Fracture lines  extend to the articular surface of the ulnar trochlear joint. The radiocapitellar joint appears intact. Associated right elbow effusion and prominent soft tissue swelling. IMPRESSION: Comminuted intra-articular and displaced fractures of the right olecranon. Associated soft tissue swelling and right elbow effusion. Electronically Signed   By: Burman Nieves M.D.   On: 04/16/2023 18:03   DG Chest 2 View  Result Date: 04/16/2023 CLINICAL DATA:  Fall about 30 minutes ago. EXAM: CHEST - 2 VIEW COMPARISON:  06/13/2017 FINDINGS: The heart size and mediastinal contours are within normal limits. Both lungs are clear. The visualized skeletal structures are unremarkable. IMPRESSION: No active cardiopulmonary disease. Electronically Signed   By: Burman Nieves M.D.   On: 04/16/2023 18:01   CT Cervical Spine Wo Contrast  Result Date: 04/16/2023 CLINICAL DATA:  Neck trauma EXAM: CT CERVICAL SPINE WITHOUT CONTRAST TECHNIQUE: Multidetector CT imaging of the cervical spine was performed without intravenous contrast. Multiplanar CT image reconstructions were also generated. RADIATION DOSE REDUCTION: This exam was performed according to the departmental dose-optimization program which includes automated exposure control, adjustment of the mA and/or kV according to patient size and/or use of iterative reconstruction technique. COMPARISON:  Cervical spine radiographs 06/04/2010 FINDINGS: Alignment: Reversal of the usual cervical lordosis without anterior subluxation. Alignment is similar to prior study and likely reflects degenerative change. Muscle spasm could also have this appearance. Skull base and vertebrae: Skull base appears intact. No vertebral compression deformities. No focal bone lesion or bone destruction. Soft tissues and spinal canal: No prevertebral soft tissue swelling. No abnormal paraspinal soft tissue mass or infiltration. Disc levels: Degenerative changes with narrowed disc spaces and endplate osteophyte  formation most prominent at C3-4, C4-5, and C5-6 levels. Since the previous study, there is progressive loss of disc height at C3-4 with prominent osteophyte formation and partial degenerative coalition. Degenerative changes in the posterior facet joints. Uncovertebral and facet joint spurring causes some bone encroachment upon neural foramina bilaterally. Upper chest: Lung apices are clear. Other: None. IMPRESSION: 1. Nonspecific reversal of the usual cervical lordosis, unchanged since prior study, most likely degenerative. 2. 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 3. No acute fracture or dislocation. Electronically Signed   By: Burman Nieves M.D.   On: 04/16/2023 17:31   CT Head Wo Contrast  Result Date: 04/16/2023 CLINICAL DATA:  Head trauma EXAM: CT HEAD WITHOUT CONTRAST TECHNIQUE: Contiguous axial images were obtained from the base of the skull through the vertex without intravenous contrast. RADIATION DOSE REDUCTION: This exam was performed according to the departmental dose-optimization program which includes automated exposure control, adjustment of the mA and/or kV according to patient size and/or use of iterative reconstruction technique. COMPARISON:  None Available. FINDINGS: Brain: No evidence of acute infarction, hemorrhage, hydrocephalus, extra-axial collection or mass lesion/mass effect. Vascular: No hyperdense vessel or unexpected calcification. Skull: Normal. Negative for fracture or focal lesion. Sinuses/Orbits: No acute finding. Other: None. IMPRESSION: No acute intracranial pathology. Electronically Signed   By: Darliss Cheney M.D.   On: 04/16/2023  17:28    EKG: Independently reviewed.  Sinus rhythm, no PR or QTc interval changes.  Assessment/Plan Principal Problem:   Closed fracture dislocation of right elbow Active Problems:   Closed fracture dislocation of right elbow joint, initial encounter   Closed fracture of right pelvis, initial  encounter (HCC)   Lightheadedness/presyncope  (please populate well all problems here in Problem List. (For example, if patient is on BP meds at home and you resume or decide to hold them, it is a problem that needs to be her. Same for CAD, COPD, HLD and so on)  Right elbow fracture -With distal humerus intra-articular fracture and radial head dislocation/subluxation, requiring hand surgery ORIF tomorrow. -On physical exam no symptoms or signs of distal limb compartment syndrome or symptoms signs of radial nerve injury -Pain control with alternative oxycodone and Dilaudid  Pelvic fracture -With no significant dislocation, orthopedic surgery recommend conservative management.  Near syncope -PE ruled out DVT ruled out. -Telemonitoring at [facility] showed no significant finding.  Unclear etiology, will check echocardiogram -Given history of paroxysmal tachycardia, recommend patient follow-up with cardiology for event monitoring.  OSA -Continue CPAP HS  DVT prophylaxis: Lovenox Code Status: Full code Family Communication: None at bedside Disposition Plan: Patient is sick with right elbow fracture requiring hand surgeon ORIF, expect more than 2 midnight hospital stay. Consults called: Trauma surgeon/hand surgeon Admission status: MedSurg admission   Emeline General MD [facility] Pager [phone]  04/17/2023, 6:58 PM

## 2023-04-17 NOTE — TOC Progression Note (Signed)
Transition of Care [facility]) - Progression Note    Patient Details  Name: Christine Arias MRN: [id number] Date of Birth: 24-Jul-1956  [facility] CM/SW Contact  Garret Reddish, RN Phone Number: 04/17/2023, 3:15 PM  Clinical Narrative:     Chart reviewed.  Noted that patient was admitted with Closed fracture dislocation of right elbow joint and closed fracture of right pelvis.  Patient will be transferred to [facility] to have surgery on the above fractures.          Expected Discharge Plan and Services         Expected Discharge Date: 04/17/23                                     Social Determinants of Health (SDOH) Interventions SDOH Screenings   Food Insecurity: No Food Insecurity (04/17/2023)  Housing: Low Risk  (04/17/2023)  Transportation Needs: No Transportation Needs (04/17/2023)  Utilities: Not At Risk (04/17/2023)  Tobacco Use: Low Risk  (04/16/2023)    Readmission Risk Interventions     No data to display

## 2023-04-17 NOTE — Discharge Summary (Signed)
Physician Discharge Summary   Patient: Christine Arias MRN: [id number] DOB: [birth year]  Admit date:     04/16/2023  Discharge date: 04/17/23  Discharge Physician: Arnetha Courser   PCP: System, Provider Not In   Recommendations at discharge:  Patient is being transferred to [facility] for trauma team evaluation and management.  Discharge Diagnoses: Principal Problem:   Closed fracture dislocation of right elbow joint, initial encounter Active Problems:   Closed fracture of right pelvis, initial encounter (HCC)   History of melanoma   Lightheadedness/presyncope   OSA on CPAP   Hospital Course: Taken from H&P.  MEICHELLE Arias is a 67 y.o. female with medical history significant for  tachycardia and shortness of breath of uncertain etiology, previously followed by cardiologist, Dr. Lady Gary with unremarkable Holter, echo and nuclear stress test in 2017, with no recent studies, who presentsThe ED following a fall onto her right side which was preceded by lightheadedness.  She had immediate pain to the right elbow and right hip.  Patient recently had a trip to [facility].  ED course and data review: Vitals within normal limits.  Troponin 5, mild anemia of 11.9 with otherwise normal labs. EKG, showing NSR at 73 with no concerning ST-T wave changes. Extensive trauma imaging significant for right elbow fracture and right pelvis fracture. CT head and C-spine negative, right shoulder x-ray without injury and chest x-ray was nonacute. The ED provider spoke with orthopedist Dr. Hyacinth Meeker who will take patient to the OR at 3 PM on 6/3.  6/3: Vitals normal.  D-dimer elevated at 3.97, likely due to fracture.  CTA was negative for PE, did show areas of heterogeneous groundglass within the dependent lungs, may be hypoventilatory changes however atypical infection is also considered.  Shotty bilateral hilar nodes are likely reactive. Lower extremity venous Doppler was also negative for any DVT.  Patient was  supposed to go to the OR with orthopedic surgery.  They just informed that she need to go to [facility] so trauma team can evaluate and do her surgery.  Dr. Carola Frost will take care of her orthopedic needs over there.  [facility] was contacted for transfer.  Patient will be going over there under hospitalist service, need a consult with trauma and Dr. Carola Frost on arrival.     Assessment and Plan: * Closed fracture dislocation of right elbow joint, initial encounter Patient being splinted in the ED with procedural sedation Pain control N.p.o. from midnight for orthopedic repair on 6/3 by Dr. Hyacinth Meeker  Closed fracture of right pelvis, initial encounter [facility]) Pain control Nonsurgical Dr. Hyacinth Meeker was consulted from the ED Further recommendations per Dr. Hyacinth Meeker  Lightheadedness/presyncope Remote history of tachycardia and shortness of breath 2017 with negative cardiac workup Patient felt lightheaded prior to fall CT head with no acute findings Troponin negative and EKG nonacute Patient had a long car ride some hours prior to to the episode Will get D-dimer though likely to be elevated because of fracture but if negative we will rule out VTE If elevated will get CTA chest to rule out PE Continuous cardiac monitoring and echocardiogram  History of melanoma No acute issues suspected  OSA on CPAP Cpap qhs   Consultants: Orthopedic surgery Procedures performed: None Disposition:  [facility] Diet recommendation:  Discharge Diet Orders (From admission, onward)     Start     Ordered   04/17/23 0000  Diet - low sodium heart healthy        04/17/23 1334  Regular diet DISCHARGE MEDICATION: Allergies as of 04/17/2023   No Known Allergies      Medication List     TAKE these medications    Biotin 1000 MCG tablet Take 1,000 mcg by mouth daily.   busPIRone 10 MG tablet Commonly known as: BUSPAR Take 10 mg by mouth 3 (three) times daily.   calcium carbonate 600 MG Tabs  tablet Commonly known as: OS-CAL Take 600 mg by mouth daily with breakfast.   escitalopram 10 MG tablet Commonly known as: LEXAPRO Take 10 mg by mouth daily.   GLUCOSAMINE CHONDROITIN ADV PO Take by mouth daily.   metoCLOPramide 10 MG tablet Commonly known as: REGLAN Take 10 mg by mouth 3 (three) times daily as needed.   pantoprazole 40 MG tablet Commonly known as: PROTONIX Take 40 mg by mouth 2 (two) times daily.   rOPINIRole 3 MG tablet Commonly known as: REQUIP Take 3 mg by mouth at bedtime.   rosuvastatin 10 MG tablet Commonly known as: CRESTOR Take 10 mg by mouth daily.   sucralfate 1 g tablet Commonly known as: CARAFATE Take 1 g by mouth 2 (two) times daily.        Discharge Exam: Filed Weights   04/16/23 1650  Weight: 75.8 kg   General.  Well-developed lady, in no acute distress. Pulmonary.  Lungs clear bilaterally, normal respiratory effort. CV.  Regular rate and rhythm, no JVD, rub or murmur. Abdomen.  Soft, nontender, nondistended, BS positive. CNS.  Alert and oriented .  No focal neurologic deficit. Extremities.  No edema, no cyanosis, pulses intact and symmetrical.  Right upper extremity in sling Psychiatry.  Judgment and insight appears normal.   Condition at discharge: stable  The results of significant diagnostics from this hospitalization (including imaging, microbiology, ancillary and laboratory) are listed below for reference.   Imaging Studies: US Venous Img Lower Bilateral (DVT)  Result Date: 04/17/2023 CLINICAL DATA:  Fall, trauma, elevated D-dimer EXAM: BILATERAL LOWER EXTREMITY VENOUS DOPPLER ULTRASOUND TECHNIQUE: Gray-scale sonography with graded compression, as well as color Doppler and duplex ultrasound were performed to evaluate the lower extremity deep venous systems from the level of the common femoral vein and including the common femoral, femoral, profunda femoral, popliteal and calf veins including the posterior tibial, peroneal  and gastrocnemius veins when visible. Spectral Doppler was utilized to evaluate flow at rest and with distal augmentation maneuvers in the common femoral, femoral and popliteal veins. COMPARISON:  None Available. FINDINGS: RIGHT LOWER EXTREMITY Common Femoral Vein: No evidence of thrombus. Normal compressibility, respiratory phasicity and response to augmentation. Saphenofemoral Junction: No evidence of thrombus. Normal compressibility and flow on color Doppler imaging. Profunda Femoral Vein: No evidence of thrombus. Normal compressibility and flow on color Doppler imaging. Femoral Vein: No evidence of thrombus. Normal compressibility, respiratory phasicity and response to augmentation. Popliteal Vein: No evidence of thrombus. Normal compressibility, respiratory phasicity and response to augmentation. Calf Veins: No evidence of thrombus. Normal compressibility and flow on color Doppler imaging. LEFT LOWER EXTREMITY Common Femoral Vein: No evidence of thrombus. Normal compressibility, respiratory phasicity and response to augmentation. Saphenofemoral Junction: No evidence of thrombus. Normal compressibility and flow on color Doppler imaging. Profunda Femoral Vein: No evidence of thrombus. Normal compressibility and flow on color Doppler imaging. Femoral Vein: No evidence of thrombus. Normal compressibility, respiratory phasicity and response to augmentation. Popliteal Vein: No evidence of thrombus. Normal compressibility, respiratory phasicity and response to augmentation. Calf Veins: No evidence of thrombus. Normal compressibility and flow on color Doppler  imaging. IMPRESSION: No evidence of deep venous thrombosis in either lower extremity. Electronically Signed   By: Judie Petit.  Shick M.D.   On: 04/17/2023 10:41   CT Angio Chest Pulmonary Embolism (PE) W or WO Contrast  Result Date: 04/17/2023 CLINICAL DATA:  Syncope, simple, normal neuro exam EXAM: CT ANGIOGRAPHY CHEST WITH CONTRAST TECHNIQUE: Multidetector CT imaging  of the chest was performed using the standard protocol during bolus administration of intravenous contrast. Multiplanar CT image reconstructions and MIPs were obtained to evaluate the vascular anatomy. RADIATION DOSE REDUCTION: This exam was performed according to the departmental dose-optimization program which includes automated exposure control, adjustment of the mA and/or kV according to patient size and/or use of iterative reconstruction technique. CONTRAST:  75mL OMNIPAQUE IOHEXOL 350 MG/ML SOLN COMPARISON:  Radiograph yesterday. FINDINGS: Cardiovascular: There are no filling defects within the pulmonary arteries to suggest pulmonary embolus. No aortic dissection or aneurysm. The heart is borderline enlarged. No pericardial effusion. Mediastinum/Nodes: Shotty 11 mm right hilar node and 10 mm left hilar node. There is no mediastinal adenopathy. Minimal hiatal hernia. Lungs/Pleura: Areas of heterogeneous ground-glass within the dependent lungs most prominent in the lower lobes. No confluent consolidation. No pleural fluid. Trachea and central airways are clear. Upper Abdomen: Subcentimeter hepatic hypodensities are too small to characterize, likely small cyst. No further imaging follow-up is needed. No acute upper abdominal findings. Musculoskeletal: Small bone island within T8. There are no acute or suspicious osseous abnormalities. Review of the MIP images confirms the above findings. IMPRESSION: 1. No pulmonary embolus. 2. Areas of heterogeneous ground-glass within the dependent lungs most prominent in the lower lobes, may be hypoventilatory changes, however atypical infection is also considered. 3. Shotty bilateral hilar nodes are likely reactive. 4. Minimal hiatal hernia. Electronically Signed   By: Narda Rutherford M.D.   On: 04/17/2023 03:19   CT PELVIS WO CONTRAST  Result Date: 04/16/2023 CLINICAL DATA:  Pelvic fracture EXAM: CT PELVIS WITHOUT CONTRAST TECHNIQUE: Multidetector CT imaging of the pelvis  was performed following the standard protocol without intravenous contrast. RADIATION DOSE REDUCTION: This exam was performed according to the departmental dose-optimization program which includes automated exposure control, adjustment of the mA and/or kV according to patient size and/or use of iterative reconstruction technique. COMPARISON:  CT 04/28/2016 FINDINGS: Urinary Tract:  Urinary bladder physiologically distended. Bowel: Anastomotic staple line in the proximal sigmoid colon. Visualized small bowel and colon are nondistended. Vascular/Lymphatic: No abdominal or pelvic adenopathy. Surgical clips about the left common femoral vessels. Reproductive:  No mass or other significant abnormality Other:  No pelvic ascites.  No free air. Musculoskeletal: Right sacral ala fracture, minimally displaced, with small adjacent anterior pelvic hematoma. Superior right pubic ramus fracture, minimally displaced. Inferior right ischiopubic ramus fracture, minimally displaced. IMPRESSION: Minimally displaced right sacral ala, right superior pubic ramus, and right inferior ischiopubic ramus fractures. Electronically Signed   By: Corlis Leak M.D.   On: 04/16/2023 21:48   CT Elbow Right Wo Contrast  Result Date: 04/16/2023 CLINICAL DATA:  Elbow trauma EXAM: CT OF THE UPPER RIGHT EXTREMITY WITHOUT CONTRAST TECHNIQUE: Multidetector CT imaging of the upper right extremity was performed according to the standard protocol. RADIATION DOSE REDUCTION: This exam was performed according to the departmental dose-optimization program which includes automated exposure control, adjustment of the mA and/or kV according to patient size and/or use of iterative reconstruction technique. COMPARISON:  Right elbow x-ray same day FINDINGS: Bones/Joint/Cartilage There is a comminuted fracture of the ulna. Fracture fragments are distracted up to 2  cm at the level of the distal humerus. There is intra-articular extension and inferior displacement of the  distal humerus as it articulates with the ulna (1 cm). There is also anterior radial head dislocation/subluxation. No joint effusion identified. Ligaments Suboptimally assessed by CT. Muscles and Tendons Grossly within normal limits on this noncontrast study. Soft tissues There is posterior subcutaneous edema and hemorrhage of the elbow. IMPRESSION: 1. Comminuted displaced fracture of the distal humerus with intra-articular extension. 2. Anterior radial head dislocation/subluxation. Electronically Signed   By: Darliss Cheney M.D.   On: 04/16/2023 21:46   DG Shoulder Right  Result Date: 04/16/2023 CLINICAL DATA:  Larey Seat 30 minutes ago.  Right shoulder pain. EXAM: RIGHT SHOULDER - 2+ VIEW COMPARISON:  None Available. FINDINGS: There is no evidence of fracture or dislocation. There is no evidence of arthropathy or other focal bone abnormality. Soft tissues are unremarkable. IMPRESSION: Negative. Electronically Signed   By: Burman Nieves M.D.   On: 04/16/2023 18:05   DG Hip Unilat W or Wo Pelvis 2-3 Views Right  Result Date: 04/16/2023 CLINICAL DATA:  Pain 30 minutes ago with right hip and groin pain. EXAM: DG HIP (WITH OR WITHOUT PELVIS) 2-3V RIGHT COMPARISON:  None Available. FINDINGS: No evidence of acute fracture or dislocation of the right hip. Nondisplaced fractures are demonstrated in the superior and inferior pubic rami. SI joints and symphysis pubis are not displaced. Mild degenerative changes in the right hip. Surgical clips in the left groin region. IMPRESSION: Nondisplaced fractures of the right superior and inferior pubic rami. Right hip appears otherwise intact. Electronically Signed   By: Burman Nieves M.D.   On: 04/16/2023 18:04   DG Elbow Complete Right  Result Date: 04/16/2023 CLINICAL DATA:  Larey Seat about 30 minutes ago with right elbow pain and hematoma. EXAM: RIGHT ELBOW - COMPLETE 3+ VIEW COMPARISON:  None Available. FINDINGS: Multiple comminuted fractures of the right olecranon with  displacement of fracture fragments. Fracture lines extend to the articular surface of the ulnar trochlear joint. The radiocapitellar joint appears intact. Associated right elbow effusion and prominent soft tissue swelling. IMPRESSION: Comminuted intra-articular and displaced fractures of the right olecranon. Associated soft tissue swelling and right elbow effusion. Electronically Signed   By: Burman Nieves M.D.   On: 04/16/2023 18:03   DG Chest 2 View  Result Date: 04/16/2023 CLINICAL DATA:  Fall about 30 minutes ago. EXAM: CHEST - 2 VIEW COMPARISON:  06/13/2017 FINDINGS: The heart size and mediastinal contours are within normal limits. Both lungs are clear. The visualized skeletal structures are unremarkable. IMPRESSION: No active cardiopulmonary disease. Electronically Signed   By: Burman Nieves M.D.   On: 04/16/2023 18:01   CT Cervical Spine Wo Contrast  Result Date: 04/16/2023 CLINICAL DATA:  Neck trauma EXAM: CT CERVICAL SPINE WITHOUT CONTRAST TECHNIQUE: Multidetector CT imaging of the cervical spine was performed without intravenous contrast. Multiplanar CT image reconstructions were also generated. RADIATION DOSE REDUCTION: This exam was performed according to the departmental dose-optimization program which includes automated exposure control, adjustment of the mA and/or kV according to patient size and/or use of iterative reconstruction technique. COMPARISON:  Cervical spine radiographs 06/04/2010 FINDINGS: Alignment: Reversal of the usual cervical lordosis without anterior subluxation. Alignment is similar to prior study and likely reflects degenerative change. Muscle spasm could also have this appearance. Skull base and vertebrae: Skull base appears intact. No vertebral compression deformities. No focal bone lesion or bone destruction. Soft tissues and spinal canal: No prevertebral soft tissue swelling. No abnormal  paraspinal soft tissue mass or infiltration. Disc levels: Degenerative changes  with narrowed disc spaces and endplate osteophyte formation most prominent at C3-4, C4-5, and C5-6 levels. Since the previous study, there is progressive loss of disc height at C3-4 with prominent osteophyte formation and partial degenerative coalition. Degenerative changes in the posterior facet joints. Uncovertebral and facet joint spurring causes some bone encroachment upon neural foramina bilaterally. Upper chest: Lung apices are clear. Other: None. IMPRESSION: 1. Nonspecific reversal of the usual cervical lordosis, unchanged since prior study, most likely degenerative. 2. 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 3. No acute fracture or dislocation. Electronically Signed   By: Burman Nieves M.D.   On: 04/16/2023 17:31   CT Head Wo Contrast  Result Date: 04/16/2023 CLINICAL DATA:  Head trauma EXAM: CT HEAD WITHOUT CONTRAST TECHNIQUE: Contiguous axial images were obtained from the base of the skull through the vertex without intravenous contrast. RADIATION DOSE REDUCTION: This exam was performed according to the departmental dose-optimization program which includes automated exposure control, adjustment of the mA and/or kV according to patient size and/or use of iterative reconstruction technique. COMPARISON:  None Available. FINDINGS: Brain: No evidence of acute infarction, hemorrhage, hydrocephalus, extra-axial collection or mass lesion/mass effect. Vascular: No hyperdense vessel or unexpected calcification. Skull: Normal. Negative for fracture or focal lesion. Sinuses/Orbits: No acute finding. Other: None. IMPRESSION: No acute intracranial pathology. Electronically Signed   By: Darliss Cheney M.D.   On: 04/16/2023 17:28    Microbiology: Results for orders placed or performed in visit on 05/31/16  Microscopic Examination     Status: None   Collection Time: 05/31/16  4:53 PM  Result Value Ref Range Status   WBC, UA None seen 0 - 5 /hpf Final   RBC, UA None seen 0 -  2 /hpf Final   Epithelial Cells (non renal) 0-10 0 - 10 /hpf Final   Casts None seen None seen /lpf Final   Bacteria, UA Few None seen/Few Final    Labs: CBC: Recent Labs  Lab 04/16/23 1655  WBC 8.7  HGB 11.9*  HCT 36.3  MCV 94.8  PLT 225   Basic Metabolic Panel: Recent Labs  Lab 04/16/23 1655  NA 140  K 3.5  CL 105  CO2 27  GLUCOSE 116*  BUN 20  CREATININE 0.73  CALCIUM 9.2   Liver Function Tests: No results for input(s): "AST", "ALT", "ALKPHOS", "BILITOT", "PROT", "ALBUMIN" in the last 168 hours. CBG: No results for input(s): "GLUCAP" in the last 168 hours.  Discharge time spent: greater than 30 minutes.  This record has been created using Conservation officer, historic buildings. Errors have been sought and corrected,but may not always be located. Such creation errors do not reflect on the standard of care.   Signed: Arnetha Courser, MD [facility] 04/17/2023

## 2023-04-18 ENCOUNTER — Encounter (HOSPITAL_COMMUNITY)
Disposition: A | Discharge status: Skilled Nursing Facility | Payer: Self-pay | Source: Other Acute Inpatient Hospital | Attending: Internal Medicine

## 2023-04-18 ENCOUNTER — Inpatient Hospital Stay (HOSPITAL_COMMUNITY): Payer: Medicare Other

## 2023-04-18 ENCOUNTER — Inpatient Hospital Stay (HOSPITAL_COMMUNITY): Admission: RE | Admit: 2023-04-18 | Payer: Medicare Other | Source: Home / Self Care | Admitting: Orthopedic Surgery

## 2023-04-18 ENCOUNTER — Inpatient Hospital Stay (HOSPITAL_COMMUNITY): Payer: Medicare Other | Admitting: Anesthesiology

## 2023-04-18 ENCOUNTER — Encounter (HOSPITAL_COMMUNITY): Payer: Self-pay | Admitting: Internal Medicine

## 2023-04-18 DIAGNOSIS — S42401A Unspecified fracture of lower end of right humerus, initial encounter for closed fracture: Secondary | ICD-10-CM | POA: Diagnosis not present

## 2023-04-18 DIAGNOSIS — S3210XA Unspecified fracture of sacrum, initial encounter for closed fracture: Secondary | ICD-10-CM

## 2023-04-18 DIAGNOSIS — R55 Syncope and collapse: Secondary | ICD-10-CM

## 2023-04-18 HISTORY — PX: ORIF ELBOW FRACTURE: SHX5031

## 2023-04-18 HISTORY — PX: SACRO-ILIAC PINNING: SHX5050

## 2023-04-18 LAB — HIV ANTIBODY (ROUTINE TESTING W REFLEX): HIV Screen 4th Generation wRfx: NONREACTIVE

## 2023-04-18 LAB — CBC
HCT: 33.3 % — ABNORMAL LOW (ref 36.0–46.0)
Hemoglobin: 11.4 g/dL — ABNORMAL LOW (ref 12.0–15.0)
MCH: 32.2 pg (ref 26.0–34.0)
MCHC: 34.2 g/dL (ref 30.0–36.0)
MCV: 94.1 fL (ref 80.0–100.0)
Platelets: 154 10*3/uL (ref 150–400)
RBC: 3.54 MIL/uL — ABNORMAL LOW (ref 3.87–5.11)
RDW: 13.8 % (ref 11.5–15.5)
WBC: 9.3 10*3/uL (ref 4.0–10.5)
nRBC: 0 % (ref 0.0–0.2)

## 2023-04-18 LAB — SURGICAL PCR SCREEN
MRSA, PCR: NEGATIVE
Staphylococcus aureus: POSITIVE — AB

## 2023-04-18 LAB — ECHOCARDIOGRAM COMPLETE
Area-P 1/2: 5.27 cm2
Height: 63 in
S' Lateral: 2.9 cm
Weight: 2878.33 oz

## 2023-04-18 SURGERY — OPEN REDUCTION INTERNAL FIXATION (ORIF) ELBOW/OLECRANON FRACTURE
Anesthesia: General | Site: Elbow | Laterality: Right

## 2023-04-18 MED ORDER — ACETAMINOPHEN 500 MG PO TABS: 1000.0000 mg | ORAL_TABLET | Freq: Once | ORAL | Status: DC | PRN

## 2023-04-18 MED ORDER — ACETAMINOPHEN 160 MG/5ML PO SOLN: 1000.0000 mg | Freq: Once | ORAL | Status: DC | PRN

## 2023-04-18 MED ORDER — PROPOFOL 1000 MG/100ML IV EMUL
INTRAVENOUS | Status: AC
  Filled 2023-04-18: qty 100

## 2023-04-18 MED ORDER — PROPOFOL 10 MG/ML IV BOLUS
INTRAVENOUS | Status: AC
  Filled 2023-04-18: qty 20

## 2023-04-18 MED ORDER — CEFAZOLIN SODIUM-DEXTROSE 2-4 GM/100ML-% IV SOLN
INTRAVENOUS | Status: AC
  Filled 2023-04-18: qty 100

## 2023-04-18 MED ORDER — OXYCODONE HCL 5 MG PO TABS: 5.0000 mg | ORAL_TABLET | Freq: Once | ORAL | Status: DC | PRN

## 2023-04-18 MED ORDER — CEFAZOLIN SODIUM-DEXTROSE 2-4 GM/100ML-% IV SOLN: 2.0000 g | INTRAVENOUS | Status: DC

## 2023-04-18 MED ORDER — DEXAMETHASONE SODIUM PHOSPHATE 10 MG/ML IJ SOLN
INTRAMUSCULAR | Status: DC | PRN
  Administered 2023-04-18: 10 mg via INTRAVENOUS

## 2023-04-18 MED ORDER — LIDOCAINE 2% (20 MG/ML) 5 ML SYRINGE
INTRAMUSCULAR | Status: DC | PRN
  Administered 2023-04-18: 60 mg via INTRAVENOUS

## 2023-04-18 MED ORDER — POVIDONE-IODINE 10 % EX SWAB
2.0000 | Freq: Once | CUTANEOUS | Status: AC
  Administered 2023-04-18: 2 via TOPICAL

## 2023-04-18 MED ORDER — OXYCODONE HCL 5 MG/5ML PO SOLN: 5.0000 mg | Freq: Once | ORAL | Status: DC | PRN

## 2023-04-18 MED ORDER — HYDROMORPHONE HCL 1 MG/ML IJ SOLN
INTRAMUSCULAR | Status: AC
  Filled 2023-04-18: qty 0.5

## 2023-04-18 MED ORDER — ACETAMINOPHEN 10 MG/ML IV SOLN
INTRAVENOUS | Status: AC
  Filled 2023-04-18: qty 100

## 2023-04-18 MED ORDER — FENTANYL CITRATE (PF) 250 MCG/5ML IJ SOLN
INTRAMUSCULAR | Status: AC
  Filled 2023-04-18: qty 5

## 2023-04-18 MED ORDER — OXYCODONE HCL 5 MG PO TABS
5.0000 mg | ORAL_TABLET | ORAL | Status: DC | PRN
  Administered 2023-04-18 – 2023-04-21 (×6): 10 mg via ORAL
  Administered 2023-04-22 (×2): 5 mg via ORAL
  Filled 2023-04-18: qty 1
  Filled 2023-04-18 (×6): qty 2
  Filled 2023-04-18: qty 1

## 2023-04-18 MED ORDER — CHLORHEXIDINE GLUCONATE 0.12 % MT SOLN
15.0000 mL | Freq: Once | OROMUCOSAL | Status: AC
  Administered 2023-04-18: 15 mL via OROMUCOSAL

## 2023-04-18 MED ORDER — 0.9 % SODIUM CHLORIDE (POUR BTL) OPTIME
TOPICAL | Status: DC | PRN
  Administered 2023-04-18 (×2): 1000 mL

## 2023-04-18 MED ORDER — SODIUM CHLORIDE 0.9 % IV SOLN: INTRAVENOUS | Status: DC | PRN

## 2023-04-18 MED ORDER — SUGAMMADEX SODIUM 200 MG/2ML IV SOLN
INTRAVENOUS | Status: DC | PRN
  Administered 2023-04-18: 200 mg via INTRAVENOUS

## 2023-04-18 MED ORDER — PROPOFOL 10 MG/ML IV BOLUS
INTRAVENOUS | Status: DC | PRN
  Administered 2023-04-18: 130 mg via INTRAVENOUS

## 2023-04-18 MED ORDER — ROCURONIUM BROMIDE 50 MG/5ML IV SOSY
PREFILLED_SYRINGE | INTRAVENOUS | Status: DC | PRN
  Administered 2023-04-18: 20 mg via INTRAVENOUS
  Administered 2023-04-18: 80 mg via INTRAVENOUS
  Administered 2023-04-18: 20 mg via INTRAVENOUS

## 2023-04-18 MED ORDER — LACTATED RINGERS IV SOLN: INTRAVENOUS | Status: DC

## 2023-04-18 MED ORDER — ACETAMINOPHEN 10 MG/ML IV SOLN
INTRAVENOUS | Status: DC | PRN
  Administered 2023-04-18: 1000 mg via INTRAVENOUS

## 2023-04-18 MED ORDER — CEFAZOLIN SODIUM-DEXTROSE 2-4 GM/100ML-% IV SOLN
2.0000 g | Freq: Three times a day (TID) | INTRAVENOUS | Status: AC
  Administered 2023-04-18 – 2023-04-19 (×3): 2 g via INTRAVENOUS
  Filled 2023-04-18 (×3): qty 100

## 2023-04-18 MED ORDER — CHLORHEXIDINE GLUCONATE 4 % EX SOLN: 60.0000 mL | Freq: Once | CUTANEOUS | Status: DC

## 2023-04-18 MED ORDER — ACETAMINOPHEN 10 MG/ML IV SOLN: 1000.0000 mg | Freq: Once | INTRAVENOUS | Status: DC | PRN

## 2023-04-18 MED ORDER — PHENYLEPHRINE HCL-NACL 20-0.9 MG/250ML-% IV SOLN
INTRAVENOUS | Status: DC | PRN
  Administered 2023-04-18: 25 ug/min via INTRAVENOUS

## 2023-04-18 MED ORDER — ACETAMINOPHEN 325 MG PO TABS
650.0000 mg | ORAL_TABLET | Freq: Three times a day (TID) | ORAL | Status: DC
  Administered 2023-04-18 – 2023-04-24 (×17): 650 mg via ORAL
  Filled 2023-04-18 (×18): qty 2

## 2023-04-18 MED ORDER — ONDANSETRON HCL 4 MG/2ML IJ SOLN
INTRAMUSCULAR | Status: DC | PRN
  Administered 2023-04-18: 4 mg via INTRAVENOUS

## 2023-04-18 MED ORDER — METHOCARBAMOL 500 MG PO TABS
500.0000 mg | ORAL_TABLET | Freq: Three times a day (TID) | ORAL | Status: DC | PRN
  Administered 2023-04-18 – 2023-04-24 (×6): 500 mg via ORAL
  Filled 2023-04-18 (×6): qty 1

## 2023-04-18 MED ORDER — ORAL CARE MOUTH RINSE: 15.0000 mL | Freq: Once | OROMUCOSAL | Status: AC

## 2023-04-18 MED ORDER — FENTANYL CITRATE (PF) 250 MCG/5ML IJ SOLN
INTRAMUSCULAR | Status: DC | PRN
  Administered 2023-04-18 (×3): 50 ug via INTRAVENOUS
  Administered 2023-04-18: 100 ug via INTRAVENOUS

## 2023-04-18 MED ORDER — PROPOFOL 500 MG/50ML IV EMUL
INTRAVENOUS | Status: DC | PRN
  Administered 2023-04-18: 25 ug/kg/min via INTRAVENOUS

## 2023-04-18 MED ORDER — FENTANYL CITRATE (PF) 100 MCG/2ML IJ SOLN: 25.0000 ug | INTRAMUSCULAR | Status: DC | PRN

## 2023-04-18 MED ORDER — CEFAZOLIN SODIUM-DEXTROSE 2-3 GM-%(50ML) IV SOLR
INTRAVENOUS | Status: DC | PRN
  Administered 2023-04-18: 2 g via INTRAVENOUS

## 2023-04-18 MED ORDER — HYDROMORPHONE HCL 1 MG/ML IJ SOLN
INTRAMUSCULAR | Status: DC | PRN
  Administered 2023-04-18: .5 mg via INTRAVENOUS

## 2023-04-18 SURGICAL SUPPLY — 101 items
ANCH SUT 2 SHRT 1.45 DRLBT (Anchor) IMPLANT
APL SKNCLS NONHYPOALLERGENIC (GAUZE/BANDAGES/DRESSINGS)
APL SKNCLS STERI-STRIP NONHPOA (GAUZE/BANDAGES/DRESSINGS)
BAG COUNTER SPONGE SURGICOUNT (BAG) ×2 IMPLANT
BAG SPNG CNTER NS LX DISP (BAG) ×2
BENZOIN TINCTURE PRP APPL 2/3 (GAUZE/BANDAGES/DRESSINGS) IMPLANT
BIT DRILL 2.0 LNG QUCK RELEASE (BIT) IMPLANT
BIT DRILL 2.8 QUICK RELEASE (BIT) IMPLANT
BLADE AVERAGE 25X9 (BLADE) ×2 IMPLANT
BLADE CLIPPER SURG (BLADE) ×2 IMPLANT
BLADE SURG 10 STRL SS (BLADE) IMPLANT
BNDG CMPR 5X3 KNIT ELC UNQ LF (GAUZE/BANDAGES/DRESSINGS) ×2
BNDG CMPR 75X41 PLY ABS (GAUZE/BANDAGES/DRESSINGS) ×2
BNDG CMPR 9X4 STRL LF SNTH (GAUZE/BANDAGES/DRESSINGS) ×2
BNDG CMPR STD VLCR NS LF 5.8X4 (GAUZE/BANDAGES/DRESSINGS) ×2
BNDG ELASTIC 3INX 5YD STR LF (GAUZE/BANDAGES/DRESSINGS) ×2 IMPLANT
BNDG ELASTIC 4X5.8 VLCR NS LF (GAUZE/BANDAGES/DRESSINGS) IMPLANT
BNDG ELASTIC 4X5.8 VLCR STR LF (GAUZE/BANDAGES/DRESSINGS) ×2 IMPLANT
BNDG ESMARK 4X9 LF (GAUZE/BANDAGES/DRESSINGS) ×2 IMPLANT
BNDG GAUZE DERMACEA FLUFF 4 (GAUZE/BANDAGES/DRESSINGS) ×2 IMPLANT
BNDG GZE DERMACEA 4 6PLY (GAUZE/BANDAGES/DRESSINGS) ×2
BNDG STRETCH 4X75 NS LF (GAUZE/BANDAGES/DRESSINGS) IMPLANT
BRUSH SCRUB EZ PLAIN DRY (MISCELLANEOUS) ×4 IMPLANT
CLEANER TIP ELECTROSURG 2X2 (MISCELLANEOUS) ×2 IMPLANT
COVER SURGICAL LIGHT HANDLE (MISCELLANEOUS) ×4 IMPLANT
CUFF TOURN SGL QUICK 18X4 (TOURNIQUET CUFF) IMPLANT
CUFF TOURN SGL QUICK 24 (TOURNIQUET CUFF)
CUFF TRNQT CYL 24X4X16.5-23 (TOURNIQUET CUFF) IMPLANT
DRAPE C-ARM 42X72 X-RAY (DRAPES) IMPLANT
DRAPE C-ARMOR (DRAPES) ×2 IMPLANT
DRAPE HALF SHEET 40X57 (DRAPES) IMPLANT
DRAPE INCISE IOBAN 66X45 STRL (DRAPES) IMPLANT
DRAPE U-SHAPE 47X51 STRL (DRAPES) ×2 IMPLANT
DRESSING MEPILEX FLEX 4X4 (GAUZE/BANDAGES/DRESSINGS) IMPLANT
DRILL 2.0 LNG QUICK RELEASE (BIT) ×2
DRILL 2.8 QUICK RELEASE (BIT) ×2
DRSG ADAPTIC 3X8 NADH LF (GAUZE/BANDAGES/DRESSINGS) IMPLANT
DRSG EMULSION OIL 3X3 NADH (GAUZE/BANDAGES/DRESSINGS) IMPLANT
DRSG MEPILEX FLEX 4X4 (GAUZE/BANDAGES/DRESSINGS) ×2
DRSG MEPITEL 4X7.2 (GAUZE/BANDAGES/DRESSINGS) IMPLANT
ELECT REM PT RETURN 9FT ADLT (ELECTROSURGICAL) ×2
ELECTRODE REM PT RTRN 9FT ADLT (ELECTROSURGICAL) ×2 IMPLANT
FACESHIELD WRAPAROUND (MASK) IMPLANT
FACESHIELD WRAPAROUND OR TEAM (MASK) IMPLANT
GAUZE SPONGE 4X4 12PLY STRL (GAUZE/BANDAGES/DRESSINGS) ×2 IMPLANT
GAUZE XEROFORM 1X8 LF (GAUZE/BANDAGES/DRESSINGS) ×2 IMPLANT
GAUZE XEROFORM 5X9 LF (GAUZE/BANDAGES/DRESSINGS) ×2 IMPLANT
GLOVE BIO SURGEON STRL SZ7.5 (GLOVE) ×2 IMPLANT
GLOVE BIO SURGEON STRL SZ8 (GLOVE) ×2 IMPLANT
GLOVE BIOGEL PI IND STRL 7.5 (GLOVE) ×2 IMPLANT
GLOVE BIOGEL PI IND STRL 8 (GLOVE) ×2 IMPLANT
GLOVE SURG ORTHO LTX SZ7.5 (GLOVE) ×4 IMPLANT
GOWN STRL REUS W/ TWL LRG LVL3 (GOWN DISPOSABLE) ×4 IMPLANT
GOWN STRL REUS W/ TWL XL LVL3 (GOWN DISPOSABLE) ×2 IMPLANT
GOWN STRL REUS W/TWL LRG LVL3 (GOWN DISPOSABLE) ×4
GOWN STRL REUS W/TWL XL LVL3 (GOWN DISPOSABLE) ×2
GUIDEWIRE ORTH 6X062XTROC NS (WIRE) IMPLANT
GUIDEWIRE ORTHO MINI ACTK .045 (WIRE) IMPLANT
K-WIRE .062 (WIRE) ×6
KIT BASIN OR (CUSTOM PROCEDURE TRAY) ×2 IMPLANT
KIT INFUSE MEDIUM (Orthopedic Implant) IMPLANT
KIT TURNOVER KIT B (KITS) ×2 IMPLANT
MANIFOLD NEPTUNE II (INSTRUMENTS) ×2 IMPLANT
NS IRRIG 1000ML POUR BTL (IV SOLUTION) ×2 IMPLANT
PACK ORTHO EXTREMITY (CUSTOM PROCEDURE TRAY) ×2 IMPLANT
PAD ARMBOARD 7.5X6 YLW CONV (MISCELLANEOUS) ×4 IMPLANT
PAD CAST 4YDX4 CTTN HI CHSV (CAST SUPPLIES) ×2 IMPLANT
PADDING CAST ABS COTTON 6X4 NS (CAST SUPPLIES) IMPLANT
PADDING CAST COTTON 4X4 STRL (CAST SUPPLIES) ×2
PIN GUIDE DRILL TIP 2.8X300 (DRILL) IMPLANT
PLATE OLECRANON RT .3HOLE (Plate) IMPLANT
SCREW CORTICAL 3.5X20MM (Screw) IMPLANT
SCREW HEX LOCK 2.7X16MM (Screw) IMPLANT
SCREW LOCK 18X3.5X HEXALOBE (Screw) IMPLANT
SCREW LOCK 28X3.5X HEXALOBE (Screw) IMPLANT
SCREW LOCKING 3.5X18MM (Screw) ×2 IMPLANT
SCREW LOCKING 3.5X28 (Screw) ×2 IMPLANT
SCREW NON LOCKING HEX 3.5X22 (Screw) IMPLANT
SCREW NONLOCK 3.5X44 (Screw) IMPLANT
SCREW PARTIAL THREAD 8.0X90MM (Screw) IMPLANT
SPIKE FLUID TRANSFER (MISCELLANEOUS) IMPLANT
SPONGE T-LAP 18X18 ~~LOC~~+RFID (SPONGE) ×4 IMPLANT
STAPLER VISISTAT 35W (STAPLE) ×2 IMPLANT
STRIP CLOSURE SKIN 1/2X4 (GAUZE/BANDAGES/DRESSINGS) IMPLANT
SUCTION TUBE FRAZIER 10FR DISP (MISCELLANEOUS) ×2 IMPLANT
SUT ETHILON 2 0 FS 18 (SUTURE) IMPLANT
SUT PDS AB 2-0 CT1 27 (SUTURE) IMPLANT
SUT PROLENE 3 0 PS 2 (SUTURE) ×4 IMPLANT
SUT VIC AB 0 CT1 27 (SUTURE) ×4
SUT VIC AB 0 CT1 27XBRD ANBCTR (SUTURE) ×4 IMPLANT
SUT VIC AB 2-0 CT1 27 (SUTURE) ×6
SUT VIC AB 2-0 CT1 TAPERPNT 27 (SUTURE) ×4 IMPLANT
SUT VIC AB 2-0 CT3 27 (SUTURE) IMPLANT
SYR CONTROL 10ML LL (SYRINGE) ×2 IMPLANT
TOWEL GREEN STERILE (TOWEL DISPOSABLE) ×4 IMPLANT
TOWEL GREEN STERILE FF (TOWEL DISPOSABLE) IMPLANT
TUBE CONNECTING 12X1/4 (SUCTIONS) ×2 IMPLANT
UNDERPAD 30X36 HEAVY ABSORB (UNDERPADS AND DIAPERS) ×2 IMPLANT
WASHER CANN FLAT 8 (Washer) IMPLANT
WATER STERILE IRR 1000ML POUR (IV SOLUTION) ×2 IMPLANT
YANKAUER SUCT BULB TIP NO VENT (SUCTIONS) ×2 IMPLANT

## 2023-04-18 NOTE — Hospital Course (Signed)
67 y.o. female with medical history significant of anxiety/depression, GERD, remote history of tachycardia in 2017, presented with presyncope and fall and double and pelvis pain.   Patient fell yesterday after a driving 3 hours trip from [place] to [place] area.  She described lightheadedness before the episode, but no LOC.  She fell on her right elbow and right side of the hip.  Excruciating pain after the fall on right elbow and pelvis.  Denies any numbness weakness of any of the limbs, denies any palpitations, nauseous vomiting chest pain before or during the episode.  Reported taking no new medications   ED Course: Afebrile, none tachycardia none hypotension nonhypoxic.  CT right elbow showed distal humerus fracture with anterior radial head dislocation/subluxation, and minimally depressed latest right sacral ala, right superior pubic ramus and right inferior ischial pubic ramus fracture

## 2023-04-18 NOTE — Transfer of Care (Signed)
Immediate Anesthesia Transfer of Care Note  Patient: Christine Arias  Procedure(s) Performed: OPEN REDUCTION INTERNAL FIXATION (ORIF) ELBOW/OLECRANON FRACTURE (Right: Elbow) SACRO-ILIAC PINNING RIGHT (Right)  Patient Location: PACU  Anesthesia Type:General  Level of Consciousness: drowsy and patient cooperative  Airway & Oxygen Therapy: Patient connected to nasal cannula oxygen  Post-op Assessment: Report given to RN, Post -op Vital signs reviewed and stable, and Patient moving all extremities X 4  Post vital signs: Reviewed and stable  Last Vitals:  Vitals Value Taken Time  BP 125/70 04/18/23 1526  Temp    Pulse 76 04/18/23 1528  Resp 11 04/18/23 1528  SpO2 95  04/18/23 1528  Vitals shown include unvalidated device data.  Last Pain:  Vitals:   04/18/23 1007  TempSrc:   PainSc: 2       Patients Stated Pain Goal: 3 (04/18/23 0834)  Complications: No notable events documented.

## 2023-04-18 NOTE — Progress Notes (Signed)
OR notified this Clinical research associate of patient on schedule this morning 6/4 @ 8am.  When I relayed information to patient, she stated she knew nothing about being on a scheduled time for surgery today.  She knew it was a possibility, but no one has spoken to her about specifics."  Pt has been NPO after midnight per MD order.  Pt has refused ortho static VS d/t pain stating she can not sit or stand for long periods of time d/t pain.  I relayed this message to OR personnel as well as an incomplete ECHO to be done per order.  Pt is upset and anxious because no one from surgery has spoken to her about what will be done during surgery, what time or what to expect.

## 2023-04-18 NOTE — Progress Notes (Signed)
  Echocardiogram 2D Echocardiogram has been performed.  Christine Arias 04/18/2023, 9:27 AM

## 2023-04-18 NOTE — Progress Notes (Signed)
  Progress Note   Patient: Christine Arias WUJ:[id number] DOB: Oct 11, 1956 DOA: 04/17/2023     1 DOS: the patient was seen and examined on 04/18/2023   Brief hospital course: 67 y.o. female with medical history significant of anxiety/depression, GERD, remote history of tachycardia in 2017, presented with presyncope and fall and double and pelvis pain.   Patient fell yesterday after a driving 3 hours trip from [place] to [place] area.  She described lightheadedness before the episode, but no LOC.  She fell on her right elbow and right side of the hip.  Excruciating pain after the fall on right elbow and pelvis.  Denies any numbness weakness of any of the limbs, denies any palpitations, nauseous vomiting chest pain before or during the episode.  Reported taking no new medications   ED Course: Afebrile, none tachycardia none hypotension nonhypoxic.  CT right elbow showed distal humerus fracture with anterior radial head dislocation/subluxation, and minimally depressed latest right sacral ala, right superior pubic ramus and right inferior ischial pubic ramus fracture  Assessment and Plan: Right elbow fracture -With distal humerus intra-articular fracture and radial head dislocation/subluxation, requiring hand surgery ORIF today. -Pt seen with Ortho -CXR reviewed, clear. EKG unremarkable. Vitals stable. Pt denies sob or chest pains. 2d echo reviewed, unremarkable. At this time benefits to surgery outweighs perioperative risk -Cont with analgesia as needed   Pelvic fracture -Per Ortho, re-exam of scan shows fracture extension all the way through the ala in addition tot he more obvious anteriorly displaced buckle fragment -Per Ortho, possible sacroiliac screw fixation on the R for stabilization    Near syncope -PE ruled out DVT ruled out. -Telemonitoring at [facility] showed no significant finding.   -2d echo reviewed, unremarkable -Given history of paroxysmal tachycardia, recommend patient follow-up with  cardiology for event monitoring.   OSA -Continue CPAP HS      Subjective: Eager to have surgery. Pt seen with Ortho  Physical Exam: Vitals:   04/17/23 1700 04/18/23 0357 04/18/23 0756 04/18/23 0947  BP:  129/69 132/71 130/62  Pulse:  83 87 80  Resp:  18 18 18   Temp:  98.4 F (36.9 C) 98.5 F (36.9 C) 98.9 F (37.2 C)  TempSrc:   Oral Oral  SpO2:  91% 94% 94%  Weight: 81.6 kg   77.1 kg  Height: 5\' 3"  (1.6 m)   5\' 3"  (1.6 m)   General exam: Awake, laying in bed, in nad Respiratory system: Normal respiratory effort, no wheezing Cardiovascular system: regular rate, s1, s2 Gastrointestinal system: Soft, nondistended, positive BS Central nervous system: CN2-12 grossly intact, strength intact Extremities: Perfused, no clubbing Skin: Normal skin turgor, no notable skin lesions seen Psychiatry: Mood normal // no visual hallucinations   Data Reviewed:  Labs reviewed: Na 140, K 3.5, Cr 0.73, Hgb 11.4  Family Communication: Pt in room, family not at bedside  Disposition: Status is: Inpatient Remains inpatient appropriate because: severity of illness  Planned Discharge Destination:  Unclear at this time, will need PT eval post-op     Author: Rickey Barbara, MD 04/18/2023 11:34 AM  For on call review [email or web address].

## 2023-04-18 NOTE — Progress Notes (Signed)
   04/18/23 2055  BiPAP/CPAP/SIPAP  Reason BIPAP/CPAP not in use Other(comment) (pt. refused)

## 2023-04-18 NOTE — Anesthesia Preprocedure Evaluation (Signed)
Anesthesia Evaluation  Patient identified by MRN, date of birth, ID band Patient awake    Reviewed: Allergy & Precautions, H&P , NPO status , Patient's Chart, lab work & pertinent test results  History of Anesthesia Complications Negative for: history of anesthetic complications  Airway Mallampati: II  TM Distance: >3 FB Neck ROM: Full    Dental  (+) Teeth Intact, Dental Advisory Given   Pulmonary neg pulmonary ROS   breath sounds clear to auscultation       Cardiovascular negative cardio ROS  Rhythm:Regular     Neuro/Psych negative neurological ROS  negative psych ROS   GI/Hepatic negative GI ROS, Neg liver ROS,,,  Endo/Other  negative endocrine ROS    Renal/GU negative Renal ROS     Musculoskeletal  RIGHT ELBOW FX   Abdominal   Peds  Hematology negative hematology ROS (+)   Anesthesia Other Findings   Reproductive/Obstetrics                             Anesthesia Physical Anesthesia Plan  ASA: 2  Anesthesia Plan: General   Post-op Pain Management: Minimal or no pain anticipated   Induction: Intravenous  PONV Risk Score and Plan: 3 and Ondansetron and Dexamethasone  Airway Management Planned: Oral ETT  Additional Equipment: None  Intra-op Plan:   Post-operative Plan: Extubation in OR  Informed Consent: I have reviewed the patients History and Physical, chart, labs and discussed the procedure including the risks, benefits and alternatives for the proposed anesthesia with the patient or authorized representative who has indicated his/her understanding and acceptance.     Dental advisory given  Plan Discussed with: CRNA  Anesthesia Plan Comments:        Anesthesia Quick Evaluation

## 2023-04-18 NOTE — Consult Note (Signed)
Reason for Consult:Right olecranon fx Referring Physician: Rickey Barbara Time called: 0730 Time at bedside: 0957   Christine Arias is an 66 y.o. female.  HPI: Kynslee was standing on a deck when she got lightheaded and fell about a foot to the concrete. She took the brunt of the fall on her right arm. She does not think she lost consciousness. She was brought to [facility] where workup showed a right olecranon fx. Orthopedics there thought she was too complex and requested transfer to [facility] for definitive care. She is retired, lives at home with her husband, and does not use any assistive devices to ambulate.  Past Medical History:  Diagnosis Date   Allergy    seasonal   Diffuse cystic mastopathy    History of diverticulitis of colon 2003   Melanoma (HCC) 2013   T1b N1 excised from left hip region-032mm deep, sn positive. Subsequently had left inguinal node dissection and interferon therapy   Personal history of other malignant neoplasm of skin 2014   Special screening for malignant neoplasms, colon    Tibial plateau fracture, left    no surgery. 2020    Past Surgical History:  Procedure Laterality Date   APPENDECTOMY     BREAST BIOPSY Left    neg core 2014   BREAST CYST ASPIRATION Bilateral 2001   CESAREAN SECTION     COLON SURGERY  2009   colectomy-sigmoid, hand assist   COLONOSCOPY  2008, 2015   Dr. Evette Cristal   INGUINAL LYMPH NODE BIOPSY Left 2013   left inguinal node dissection,positve sn d/t melanoma left hip region.   LAPAROSCOPY  2009   MELANOMA EXCISION Left 2013   excised from hip region-0.33mm deep, sentinel node positive. Subsequently had left inquinal node dissection and interferon therapy   TONSILLECTOMY AND ADENOIDECTOMY     TUBAL LIGATION      Family History  Problem Relation Age of Onset   Cancer Father        cancer of throat, cancer of lung   Colon polyps Mother    Breast cancer Maternal Aunt 33   Breast cancer Maternal Aunt 68   Breast cancer Maternal Aunt  36    Social History:  reports that she has never smoked. She has never used smokeless tobacco. She reports current alcohol use. She reports that she does not use drugs.  Allergies: No Known Allergies  Medications: I have reviewed the patient's current medications.  Results for orders placed or performed during the hospital encounter of 04/17/23 (from the past 48 hour(s))  CBC     Status: Abnormal   Collection Time: 04/18/23  2:15 AM  Result Value Ref Range   WBC 9.3 4.0 - 10.5 K/uL   RBC 3.54 (L) 3.87 - 5.11 MIL/uL   Hemoglobin 11.4 (L) 12.0 - 15.0 g/dL   HCT 78.2 (L) 95.6 - 21.3 %   MCV 94.1 80.0 - 100.0 fL   MCH 32.2 26.0 - 34.0 pg   MCHC 34.2 30.0 - 36.0 g/dL   RDW 08.6 57.8 - 46.9 %   Platelets 154 150 - 400 K/uL   nRBC 0.0 0.0 - 0.2 %    Comment: Performed at [facility], [address] [place], [place] [address]    US Venous Img Lower Bilateral (DVT)  Result Date: 04/17/2023 CLINICAL DATA:  Fall, trauma, elevated D-dimer EXAM: BILATERAL LOWER EXTREMITY VENOUS DOPPLER ULTRASOUND TECHNIQUE: Gray-scale sonography with graded compression, as well as color Doppler and duplex ultrasound  were performed to evaluate the lower extremity deep venous systems from the level of the common femoral vein and including the common femoral, femoral, profunda femoral, popliteal and calf veins including the posterior tibial, peroneal and gastrocnemius veins when visible. Spectral Doppler was utilized to evaluate flow at rest and with distal augmentation maneuvers in the common femoral, femoral and popliteal veins. COMPARISON:  None Available. FINDINGS: RIGHT LOWER EXTREMITY Common Femoral Vein: No evidence of thrombus. Normal compressibility, respiratory phasicity and response to augmentation. Saphenofemoral Junction: No evidence of thrombus. Normal compressibility and flow on color Doppler imaging. Profunda Femoral Vein: No evidence of thrombus. Normal compressibility and flow on color Doppler  imaging. Femoral Vein: No evidence of thrombus. Normal compressibility, respiratory phasicity and response to augmentation. Popliteal Vein: No evidence of thrombus. Normal compressibility, respiratory phasicity and response to augmentation. Calf Veins: No evidence of thrombus. Normal compressibility and flow on color Doppler imaging. LEFT LOWER EXTREMITY Common Femoral Vein: No evidence of thrombus. Normal compressibility, respiratory phasicity and response to augmentation. Saphenofemoral Junction: No evidence of thrombus. Normal compressibility and flow on color Doppler imaging. Profunda Femoral Vein: No evidence of thrombus. Normal compressibility and flow on color Doppler imaging. Femoral Vein: No evidence of thrombus. Normal compressibility, respiratory phasicity and response to augmentation. Popliteal Vein: No evidence of thrombus. Normal compressibility, respiratory phasicity and response to augmentation. Calf Veins: No evidence of thrombus. Normal compressibility and flow on color Doppler imaging. IMPRESSION: No evidence of deep venous thrombosis in either lower extremity. Electronically Signed   By: Judie Petit.  Shick M.D.   On: 04/17/2023 10:41   CT Angio Chest Pulmonary Embolism (PE) W or WO Contrast  Result Date: 04/17/2023 CLINICAL DATA:  Syncope, simple, normal neuro exam EXAM: CT ANGIOGRAPHY CHEST WITH CONTRAST TECHNIQUE: Multidetector CT imaging of the chest was performed using the standard protocol during bolus administration of intravenous contrast. Multiplanar CT image reconstructions and MIPs were obtained to evaluate the vascular anatomy. RADIATION DOSE REDUCTION: This exam was performed according to the departmental dose-optimization program which includes automated exposure control, adjustment of the mA and/or kV according to patient size and/or use of iterative reconstruction technique. CONTRAST:  75mL OMNIPAQUE IOHEXOL 350 MG/ML SOLN COMPARISON:  Radiograph yesterday. FINDINGS: Cardiovascular:  There are no filling defects within the pulmonary arteries to suggest pulmonary embolus. No aortic dissection or aneurysm. The heart is borderline enlarged. No pericardial effusion. Mediastinum/Nodes: Shotty 11 mm right hilar node and 10 mm left hilar node. There is no mediastinal adenopathy. Minimal hiatal hernia. Lungs/Pleura: Areas of heterogeneous ground-glass within the dependent lungs most prominent in the lower lobes. No confluent consolidation. No pleural fluid. Trachea and central airways are clear. Upper Abdomen: Subcentimeter hepatic hypodensities are too small to characterize, likely small cyst. No further imaging follow-up is needed. No acute upper abdominal findings. Musculoskeletal: Small bone island within T8. There are no acute or suspicious osseous abnormalities. Review of the MIP images confirms the above findings. IMPRESSION: 1. No pulmonary embolus. 2. Areas of heterogeneous ground-glass within the dependent lungs most prominent in the lower lobes, may be hypoventilatory changes, however atypical infection is also considered. 3. Shotty bilateral hilar nodes are likely reactive. 4. Minimal hiatal hernia. Electronically Signed   By: Narda Rutherford M.D.   On: 04/17/2023 03:19   CT PELVIS WO CONTRAST  Result Date: 04/16/2023 CLINICAL DATA:  Pelvic fracture EXAM: CT PELVIS WITHOUT CONTRAST TECHNIQUE: Multidetector CT imaging of the pelvis was performed following the standard protocol without intravenous contrast. RADIATION DOSE REDUCTION: This exam  was performed according to the departmental dose-optimization program which includes automated exposure control, adjustment of the mA and/or kV according to patient size and/or use of iterative reconstruction technique. COMPARISON:  CT 04/28/2016 FINDINGS: Urinary Tract:  Urinary bladder physiologically distended. Bowel: Anastomotic staple line in the proximal sigmoid colon. Visualized small bowel and colon are nondistended. Vascular/Lymphatic: No  abdominal or pelvic adenopathy. Surgical clips about the left common femoral vessels. Reproductive:  No mass or other significant abnormality Other:  No pelvic ascites.  No free air. Musculoskeletal: Right sacral ala fracture, minimally displaced, with small adjacent anterior pelvic hematoma. Superior right pubic ramus fracture, minimally displaced. Inferior right ischiopubic ramus fracture, minimally displaced. IMPRESSION: Minimally displaced right sacral ala, right superior pubic ramus, and right inferior ischiopubic ramus fractures. Electronically Signed   By: Corlis Leak M.D.   On: 04/16/2023 21:48   CT Elbow Right Wo Contrast  Result Date: 04/16/2023 CLINICAL DATA:  Elbow trauma EXAM: CT OF THE UPPER RIGHT EXTREMITY WITHOUT CONTRAST TECHNIQUE: Multidetector CT imaging of the upper right extremity was performed according to the standard protocol. RADIATION DOSE REDUCTION: This exam was performed according to the departmental dose-optimization program which includes automated exposure control, adjustment of the mA and/or kV according to patient size and/or use of iterative reconstruction technique. COMPARISON:  Right elbow x-ray same day FINDINGS: Bones/Joint/Cartilage There is a comminuted fracture of the ulna. Fracture fragments are distracted up to 2 cm at the level of the distal humerus. There is intra-articular extension and inferior displacement of the distal humerus as it articulates with the ulna (1 cm). There is also anterior radial head dislocation/subluxation. No joint effusion identified. Ligaments Suboptimally assessed by CT. Muscles and Tendons Grossly within normal limits on this noncontrast study. Soft tissues There is posterior subcutaneous edema and hemorrhage of the elbow. IMPRESSION: 1. Comminuted displaced fracture of the distal humerus with intra-articular extension. 2. Anterior radial head dislocation/subluxation. Electronically Signed   By: Darliss Cheney M.D.   On: 04/16/2023 21:46    DG Shoulder Right  Result Date: 04/16/2023 CLINICAL DATA:  Larey Seat 30 minutes ago.  Right shoulder pain. EXAM: RIGHT SHOULDER - 2+ VIEW COMPARISON:  None Available. FINDINGS: There is no evidence of fracture or dislocation. There is no evidence of arthropathy or other focal bone abnormality. Soft tissues are unremarkable. IMPRESSION: Negative. Electronically Signed   By: Burman Nieves M.D.   On: 04/16/2023 18:05   DG Hip Unilat W or Wo Pelvis 2-3 Views Right  Result Date: 04/16/2023 CLINICAL DATA:  Pain 30 minutes ago with right hip and groin pain. EXAM: DG HIP (WITH OR WITHOUT PELVIS) 2-3V RIGHT COMPARISON:  None Available. FINDINGS: No evidence of acute fracture or dislocation of the right hip. Nondisplaced fractures are demonstrated in the superior and inferior pubic rami. SI joints and symphysis pubis are not displaced. Mild degenerative changes in the right hip. Surgical clips in the left groin region. IMPRESSION: Nondisplaced fractures of the right superior and inferior pubic rami. Right hip appears otherwise intact. Electronically Signed   By: Burman Nieves M.D.   On: 04/16/2023 18:04   DG Elbow Complete Right  Result Date: 04/16/2023 CLINICAL DATA:  Larey Seat about 30 minutes ago with right elbow pain and hematoma. EXAM: RIGHT ELBOW - COMPLETE 3+ VIEW COMPARISON:  None Available. FINDINGS: Multiple comminuted fractures of the right olecranon with displacement of fracture fragments. Fracture lines extend to the articular surface of the ulnar trochlear joint. The radiocapitellar joint appears intact. Associated right elbow effusion and  prominent soft tissue swelling. IMPRESSION: Comminuted intra-articular and displaced fractures of the right olecranon. Associated soft tissue swelling and right elbow effusion. Electronically Signed   By: Burman Nieves M.D.   On: 04/16/2023 18:03   DG Chest 2 View  Result Date: 04/16/2023 CLINICAL DATA:  Fall about 30 minutes ago. EXAM: CHEST - 2 VIEW COMPARISON:   06/13/2017 FINDINGS: The heart size and mediastinal contours are within normal limits. Both lungs are clear. The visualized skeletal structures are unremarkable. IMPRESSION: No active cardiopulmonary disease. Electronically Signed   By: Burman Nieves M.D.   On: 04/16/2023 18:01   CT Cervical Spine Wo Contrast  Result Date: 04/16/2023 CLINICAL DATA:  Neck trauma EXAM: CT CERVICAL SPINE WITHOUT CONTRAST TECHNIQUE: Multidetector CT imaging of the cervical spine was performed without intravenous contrast. Multiplanar CT image reconstructions were also generated. RADIATION DOSE REDUCTION: This exam was performed according to the departmental dose-optimization program which includes automated exposure control, adjustment of the mA and/or kV according to patient size and/or use of iterative reconstruction technique. COMPARISON:  Cervical spine radiographs 06/04/2010 FINDINGS: Alignment: Reversal of the usual cervical lordosis without anterior subluxation. Alignment is similar to prior study and likely reflects degenerative change. Muscle spasm could also have this appearance. Skull base and vertebrae: Skull base appears intact. No vertebral compression deformities. No focal bone lesion or bone destruction. Soft tissues and spinal canal: No prevertebral soft tissue swelling. No abnormal paraspinal soft tissue mass or infiltration. Disc levels: Degenerative changes with narrowed disc spaces and endplate osteophyte formation most prominent at C3-4, C4-5, and C5-6 levels. Since the previous study, there is progressive loss of disc height at C3-4 with prominent osteophyte formation and partial degenerative coalition. Degenerative changes in the posterior facet joints. Uncovertebral and facet joint spurring causes some bone encroachment upon neural foramina bilaterally. Upper chest: Lung apices are clear. Other: None. IMPRESSION: 1. Nonspecific reversal of the usual cervical lordosis, unchanged since prior study, most  likely degenerative. 2. 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 3. No acute fracture or dislocation. Electronically Signed   By: Burman Nieves M.D.   On: 04/16/2023 17:31   CT Head Wo Contrast  Result Date: 04/16/2023 CLINICAL DATA:  Head trauma EXAM: CT HEAD WITHOUT CONTRAST TECHNIQUE: Contiguous axial images were obtained from the base of the skull through the vertex without intravenous contrast. RADIATION DOSE REDUCTION: This exam was performed according to the departmental dose-optimization program which includes automated exposure control, adjustment of the mA and/or kV according to patient size and/or use of iterative reconstruction technique. COMPARISON:  None Available. FINDINGS: Brain: No evidence of acute infarction, hemorrhage, hydrocephalus, extra-axial collection or mass lesion/mass effect. Vascular: No hyperdense vessel or unexpected calcification. Skull: Normal. Negative for fracture or focal lesion. Sinuses/Orbits: No acute finding. Other: None. IMPRESSION: No acute intracranial pathology. Electronically Signed   By: Darliss Cheney M.D.   On: 04/16/2023 17:28    Review of Systems  HENT:  Negative for ear discharge, ear pain, hearing loss and tinnitus.   Eyes:  Negative for photophobia and pain.  Respiratory:  Negative for cough and shortness of breath.   Cardiovascular:  Negative for chest pain.  Gastrointestinal:  Negative for abdominal pain, nausea and vomiting.  Genitourinary:  Negative for dysuria, flank pain, frequency and urgency.  Musculoskeletal:  Positive for arthralgias (Right elbow, thumb, pelvis). Negative for back pain, myalgias and neck pain.  Neurological:  Negative for dizziness and headaches.  Hematological:  Does not bruise/bleed easily.  Psychiatric/Behavioral:  The patient is not nervous/anxious.    Blood pressure 132/71, pulse 87, temperature 98.5 F (36.9 C), temperature source Oral, resp. rate 18, height 5\' 3"  (1.6 m),  weight 81.6 kg, SpO2 94 %. Physical Exam Constitutional:      General: She is not in acute distress.    Appearance: She is well-developed. She is not diaphoretic.  HENT:     Head: Normocephalic and atraumatic.  Eyes:     General: No scleral icterus.       Right eye: No discharge.        Left eye: No discharge.     Conjunctiva/sclera: Conjunctivae normal.  Cardiovascular:     Rate and Rhythm: Normal rate and regular rhythm.  Pulmonary:     Effort: Pulmonary effort is normal. No respiratory distress.  Musculoskeletal:     Cervical back: Normal range of motion.     Comments: Right shoulder, elbow, wrist, digits- no skin wounds, posterior elbow splint in place, mod TTP base of thumb and snuff box, no instability, no blocks to motion  Sens  Ax/R/M/U intact  Mot   Ax/ R/ PIN/ M/ AIN/ U intact  Rad 2+  Skin:    General: Skin is warm and dry.  Neurological:     Mental Status: She is alert.  Psychiatric:        Mood and Affect: Mood normal.        Behavior: Behavior normal.     Assessment/Plan: Right olecranon fx -- Plan ORIF today by Dr. Carola Frost as long as cleared by IM. Pelvic fxs -- Appear stable, plan non-operative management with WBAT BLE. Right thumb pain -- Will check x-rays Multiple medical problems including anxiety/depression and GERD -- per primary service    Freeman Caldron, PA-C Orthopedic Surgery [phone] 04/18/2023, 9:21 AM

## 2023-04-18 NOTE — Anesthesia Procedure Notes (Signed)
Procedure Name: Intubation Date/Time: 04/18/2023 12:32 PM  Performed by: Adria Dill, CRNAPre-anesthesia Checklist: Patient identified, Emergency Drugs available, Suction available and Patient being monitored Patient Re-evaluated:Patient Re-evaluated prior to induction Oxygen Delivery Method: Circle system utilized Preoxygenation: Pre-oxygenation with 100% oxygen Induction Type: IV induction Ventilation: Mask ventilation without difficulty Laryngoscope Size: Glidescope and 3 Grade View: Grade I Tube type: Oral Tube size: 7.0 mm Number of attempts: 1 Airway Equipment and Method: Rigid stylet and Video-laryngoscopy Placement Confirmation: positive ETCO2, breath sounds checked- equal and bilateral and ETT inserted through vocal cords under direct vision Secured at: 21 cm Tube secured with: Tape Dental Injury: Teeth and Oropharynx as per pre-operative assessment

## 2023-04-18 NOTE — Progress Notes (Signed)
PT Cancellation Note  Patient Details Name: Christine Arias MRN: [id number] DOB: 1956/06/27   Cancelled Treatment:    Reason Eval/Treat Not Completed: Patient at procedure or test/unavailable.  Gone to surgery and will reattempt at another time.   Ivar Drape 04/18/2023, 10:57 AM  Samul Dada, PT PhD Acute Rehab Dept. Number: [facility] [id number] and [facility] [phone]

## 2023-04-18 NOTE — Progress Notes (Signed)
Pt. Refused cpap. 

## 2023-04-18 NOTE — Plan of Care (Signed)

## 2023-04-19 DIAGNOSIS — S42401D Unspecified fracture of lower end of right humerus, subsequent encounter for fracture with routine healing: Secondary | ICD-10-CM

## 2023-04-19 LAB — CBC
HCT: 32.7 % — ABNORMAL LOW (ref 36.0–46.0)
Hemoglobin: 10.8 g/dL — ABNORMAL LOW (ref 12.0–15.0)
MCH: 31.8 pg (ref 26.0–34.0)
MCHC: 33 g/dL (ref 30.0–36.0)
MCV: 96.2 fL (ref 80.0–100.0)
Platelets: 165 10*3/uL (ref 150–400)
RBC: 3.4 MIL/uL — ABNORMAL LOW (ref 3.87–5.11)
RDW: 13.7 % (ref 11.5–15.5)
WBC: 9.9 10*3/uL (ref 4.0–10.5)
nRBC: 0.3 % — ABNORMAL HIGH (ref 0.0–0.2)

## 2023-04-19 LAB — VITAMIN D 25 HYDROXY (VIT D DEFICIENCY, FRACTURES): Vit D, 25-Hydroxy: 29.12 ng/mL — ABNORMAL LOW (ref 30–100)

## 2023-04-19 MED ORDER — ACETAMINOPHEN 325 MG PO TABS: 650.0000 mg | ORAL_TABLET | Freq: Four times a day (QID) | ORAL | Status: DC | PRN

## 2023-04-19 MED ORDER — MUPIROCIN 2 % EX OINT
1.0000 | TOPICAL_OINTMENT | Freq: Two times a day (BID) | CUTANEOUS | Status: DC
  Administered 2023-04-19 – 2023-04-23 (×9): 1 via NASAL
  Filled 2023-04-19 (×2): qty 22

## 2023-04-19 MED ORDER — LIP MEDEX EX OINT
TOPICAL_OINTMENT | CUTANEOUS | Status: DC | PRN
  Filled 2023-04-19: qty 7

## 2023-04-19 MED ORDER — CHLORHEXIDINE GLUCONATE CLOTH 2 % EX PADS
6.0000 | MEDICATED_PAD | Freq: Every day | CUTANEOUS | Status: DC
  Administered 2023-04-19 – 2023-04-23 (×5): 6 via TOPICAL

## 2023-04-19 MED FILL — Hydromorphone HCl Inj 1 MG/ML: INTRAMUSCULAR | Qty: 0.5 | Status: AC

## 2023-04-19 NOTE — Progress Notes (Signed)
Pt refused bipap/cpap at this time.

## 2023-04-19 NOTE — Plan of Care (Signed)

## 2023-04-19 NOTE — Anesthesia Postprocedure Evaluation (Signed)
Anesthesia Post Note  Patient: Christine Arias  Procedure(s) Performed: OPEN REDUCTION INTERNAL FIXATION (ORIF) ELBOW/OLECRANON FRACTURE (Right: Elbow) SACRO-ILIAC PINNING RIGHT (Right)     Patient location during evaluation: PACU Anesthesia Type: General Level of consciousness: awake and alert Pain management: pain level controlled Vital Signs Assessment: post-procedure vital signs reviewed and stable Respiratory status: spontaneous breathing, nonlabored ventilation, respiratory function stable and patient connected to nasal cannula oxygen Cardiovascular status: blood pressure returned to baseline and stable Postop Assessment: no apparent nausea or vomiting Anesthetic complications: no  There were no known notable events for this encounter.  Last Vitals:  Vitals:   04/19/23 0822 04/19/23 1504  BP: 128/76 (!) 120/57  Pulse: 79 79  Resp: 18 18  Temp: (!) 36.4 C 37.8 C  SpO2: 98% 97%    Last Pain:  Vitals:   04/19/23 1914  TempSrc:   PainSc: 0-No pain                 Eldena Dede S

## 2023-04-19 NOTE — Progress Notes (Signed)
Orthopaedic Trauma Service Progress Note  Patient ID: Christine Arias MRN: [id number] DOB/AGE: 07/02/1956 67 y.o.  Subjective:  Pain much improved in low back and R hip  Some burning along R elbow  Lives with husband   No other complaints    ROS As above  Objective:   VITALS:   Vitals:   04/18/23 1630 04/18/23 2140 04/19/23 0520 04/19/23 0822  BP: 129/66 117/60 126/74 128/76  Pulse:  83 72 79  Resp:  18 13 18   Temp: 98.8 F (37.1 C) 98 F (36.7 C) 98.2 F (36.8 C) (!) 97.5 F (36.4 C)  TempSrc: Oral Oral Oral Oral  SpO2: 96% 97% 98% 98%  Weight:      Height:        Estimated body mass index is 30.11 kg/m as calculated from the following:   Height as of this encounter: 5\' 3"  (1.6 m).   Weight as of this encounter: 77.1 kg.   Intake/Output      06/04 0701 06/05 0700 06/05 0701 06/06 0700   P.O.  240   I.V. (mL/kg) 1100 (14.3)    IV Piggyback 150    Total Intake(mL/kg) 1250 (16.2) 240 (3.1)   Urine (mL/kg/hr) 1650 (0.9) 0 (0)   Emesis/NG output  0   Other  0   Stool  0   Blood 50 0   Total Output 1700 0   Net -450 +240        Urine Occurrence  0 x   Stool Occurrence  0 x   Emesis Occurrence  0 x     LABS  Results for orders placed or performed during the hospital encounter of 04/17/23 (from the past 24 hour(s))  VITAMIN D 25 Hydroxy (Vit-D Deficiency, Fractures)     Status: Abnormal   Collection Time: 04/19/23  2:17 AM  Result Value Ref Range   Vit D, 25-Hydroxy 29.12 (L) 30 - 100 ng/mL  CBC     Status: Abnormal   Collection Time: 04/19/23  2:17 AM  Result Value Ref Range   WBC 9.9 4.0 - 10.5 K/uL   RBC 3.40 (L) 3.87 - 5.11 MIL/uL   Hemoglobin 10.8 (L) 12.0 - 15.0 g/dL   HCT 82.9 (L) 56.2 - 13.0 %   MCV 96.2 80.0 - 100.0 fL   MCH 31.8 26.0 - 34.0 pg   MCHC 33.0 30.0 - 36.0 g/dL   RDW 86.5 78.4 - 69.6 %   Platelets 165 150 - 400 K/uL   nRBC 0.3 (H) 0.0 - 0.2 %      PHYSICAL EXAM:   Gen: awake, alert, very pleasant  Lungs: unlabored Cardiac: reg Ext:       Right Upper Extremtiy   Splint clean and intact  Ext warm   Minimal swelling   Sling in place  Brisk cap refill  Motor and sensory functions intact        Right Lower Extremity   Dressing R flank clean   EHL and ankle extension intact  DPN sensation intact  Exam o/w stable and unremarkable     Assessment/Plan: 1 Day Post-Op   Principal Problem:   Closed fracture dislocation of right elbow Active Problems:   Closed fracture dislocation of right elbow joint, initial encounter   Closed fracture of right pelvis, initial  encounter [facility])   Lightheadedness/presyncope   Near syncope   Anti-infectives (From admission, onward)    Start     Dose/Rate Route Frequency Ordered Stop   04/18/23 1730  ceFAZolin (ANCEF) IVPB 2g/100 mL premix        2 g 200 mL/hr over 30 Minutes Intravenous Every 8 hours 04/18/23 1632 04/19/23 0546   04/18/23 1000  ceFAZolin (ANCEF) IVPB 2g/100 mL premix  Status:  Discontinued        2 g 200 mL/hr over 30 Minutes Intravenous On call to O.R. 04/18/23 0947 04/18/23 1628   04/18/23 0949  ceFAZolin (ANCEF) 2-4 GM/100ML-% IVPB       Note to Pharmacy: Shanda Bumps M: cabinet override      04/18/23 0949 04/18/23 2159     .  POD/HD#: 1  Fall with R olecranon fracture/elbow dislocation, R pelvic ring fracture  -R olecranon fracture/elbow dislocation s/p ORIF R olecranon  Weightbearing No lifting with R arm  Ok to [employer] using platform    ROM/Activity   No elbow motion yet as she is splinted   Will start gentle motion in about 1 week    Wound care   Splint x 1 week then start regular wound care   - R pelvic ring fracture s/p SI screw   WBAT R leg with walker   No motion restrictions   Therapy evals   - Pain management:  Multimodal   - ABL anemia/Hemodynamics  Stable  - Medical issues   Per primary   - DVT/PE prophylaxis:  Lovenox  while inpatient   Will likely transition to eliquis 2.5 mg po BID x 30 days upon dc   - ID:   Periop abx   - Metabolic Bone Disease:  Vitamin d insufficiency    Supplement   - Activity:  As above   - Dispo:  Therapy evals  Pt really wants to dc home   Possibly ready Friday/sat    [name], PA-C [phone] (C) 04/19/2023, 11:19 AM  [facility] [address] [place] [address] [phone] Collier Bullock (F)    After 5pm and on the weekends please log on to Amion, go to orthopaedics and the look under the [facility] Call for the provider(s) on call. You can also call our office at [phone] and then follow the prompts to be connected to the call team.  Patient ID: Christine Arias, female   DOB: 1956-02-09, 67 y.o.   MRN: [id number]

## 2023-04-19 NOTE — Progress Notes (Signed)
   Christine Arias  ZOX:[id number] DOB: 12-29-1955 DOA: 04/17/2023 PCP: System, Provider Not In    Brief Narrative:  67 year old with a history of anxiety/depression and GERD who suffered a fall with resultant elbow and pelvic pain.  CT of the right elbow revealed a distal humerus fracture with anterior radial head dislocation/subluxation.  CT of the pelvis revealed right pubic rami fractures and a minimally displaced right sacral alla.  Consultants:  Orthopedic Surgery  Goals of Care:  Code Status: Full Code   DVT prophylaxis: Lovenox  Interim Hx: Afebrile.  Vital signs stable.  In good spirits.  No new complaints today.  Assessment & Plan:  Right elbow/distal humerus fracture Underwent ORIF per surgery 04/18/2023 -postoperative care per orthopedics  Pelvic fracture Orthopedics felt that repeat imaging actually revealed a fracture all the way through the alla in addition to the more obvious anterior displaced buckle fragment - now s/p sacroiliac screw fixation -postoperative care per orthopedics  Near syncope This preceded the patient's fall - she had just completed a 3-hour [place] -PE and DVT ruled out -no findings on telemetry monitoring -TTE unrevealing -has a remote history of paroxysmal tachycardia therefore if symptoms recur could consider event monitor  Chronic OSA Continue usual CPAP regimen  Family Communication: No family present at time of exam Disposition: Medically stable for discharge when cleared by orthopedic surgery   Objective: Blood pressure 128/76, pulse 79, temperature (!) 97.5 F (36.4 C), temperature source Oral, resp. rate 18, height 5\' 3"  (1.6 m), weight 77.1 kg, SpO2 98 %.  Intake/Output Summary (Last 24 hours) at 04/19/2023 1122 Last data filed at 04/19/2023 1000 Gross per 24 hour  Intake 1490 ml  Output 1500 ml  Net -10 ml   Filed Weights   04/17/23 1700 04/18/23 0947  Weight: 81.6 kg 77.1 kg    Examination: General: No  acute respiratory distress Lungs: Clear to auscultation bilaterally without wheezes or crackles Cardiovascular: Regular rate and rhythm without murmur gallop or rub normal S1 and S2 Abdomen: Nontender, nondistended, soft, bowel sounds positive, no rebound, no ascites, no appreciable mass Extremities: No significant cyanosis, clubbing, or edema bilateral lower extremities  CBC: Recent Labs  Lab 04/16/23 1655 04/18/23 0215 04/19/23 0217  WBC 8.7 9.3 9.9  HGB 11.9* 11.4* 10.8*  HCT 36.3 33.3* 32.7*  MCV 94.8 94.1 96.2  PLT 225 154 165   Basic Metabolic Panel: Recent Labs  Lab 04/16/23 1655  NA 140  K 3.5  CL 105  CO2 27  GLUCOSE 116*  BUN 20  CREATININE 0.73  CALCIUM 9.2   GFR: Estimated Creatinine Clearance: 65.3 mL/min (by C-G formula based on SCr of 0.73 mg/dL).   Scheduled Meds:  acetaminophen  650 mg Oral Q8H   busPIRone  10 mg Oral TID   enoxaparin (LOVENOX) injection  40 mg Subcutaneous Q24H   escitalopram  10 mg Oral Daily   pantoprazole  40 mg Oral BID   rOPINIRole  3 mg Oral QHS   rosuvastatin  10 mg Oral Daily   sucralfate  1 g Oral BID     LOS: 2 days   Lonia Blood, MD [facility] Office  [phone] Pager - Text Page per Loretha Stapler  If 7PM-7AM, please contact night-coverage per Amion 04/19/2023, 11:22 AM

## 2023-04-19 NOTE — Progress Notes (Signed)
Physical Therapy Evaluation Patient Details Name: Christine Arias MRN: [id number] DOB: Nov 09, 1956 Today's Date: 04/19/2023  History of Present Illness  67 yo female admitted 6/3 after sustaining presyncopal event with a fall to R hip and elbow.  Received R olecranon fracture with elbow dislocation, ORIF olecranon; minimally displaced R sacral ala fracture, R superior pubic ramus and R inferior ischiopubic ramus fractures with R SI screw fixation.  PMHx: anxiety, depression, GERD, tachycardia  Clinical Impression  Pt was admitted to hosp from a fall on R side, and note that pt is living out of town currently but has family locally to assist her.  Pt is demonstrating a good awareness of what she can do to assist moving, but also where she needs to improve.  Focus on standing balance and movement to protect injuries, as well as gait progression as tolerated.  Stair training needs to be completed when possible.       Recommendations for follow up therapy are one component of a multi-disciplinary discharge planning process, led by the attending physician.  Recommendations may be updated based on patient status, additional functional criteria and insurance authorization.  Follow Up Recommendations       Assistance Recommended at Discharge Frequent or constant Supervision/Assistance  Patient can return home with the following  A lot of help with walking and/or transfers;A lot of help with bathing/dressing/bathroom;Assistance with cooking/housework;Assist for transportation;Help with stairs or ramp for entrance    Equipment Recommendations Other (comment);BSC/3in1 (platform walker)  Recommendations for Other Services       Functional Status Assessment Patient has had a recent decline in their functional status and demonstrates the ability to make significant improvements in function in a reasonable and predictable amount of time.     Precautions / Restrictions Precautions Precautions:  Fall Precaution Comments: No lifting/push/pull with R arm. Ok to [employer] using platform Required Braces or Orthoses: Sling;Splint/Cast Splint/Cast: R elbow splint and sling Restrictions Weight Bearing Restrictions: Yes RLE Weight Bearing: Weight bearing as tolerated (on platform attachment) Other Position/Activity Restrictions: WBAT on LE's      Mobility  Bed Mobility Overal bed mobility: Needs Assistance Bed Mobility: Supine to Sit     Supine to sit: Mod assist     General bed mobility comments: pt required slow progression to move to L side and protect R arm and hip, helped to roll and then mod assist to sit up from sidelying    Transfers Overall transfer level: Needs assistance Equipment used: Rolling walker (2 wheels), 1 person hand held assist Transfers: Sit to/from Stand, Bed to chair/wheelchair/BSC Sit to Stand: Mod assist   Step pivot transfers: Min assist       General transfer comment: taking her time to get to chair but good quality with help to maneuver walker    Ambulation/Gait               General Gait Details: all gait was encompassed with transition to chair  Stairs            Wheelchair Mobility    Modified Rankin (Stroke Patients Only)       Balance Overall balance assessment: Needs assistance, History of Falls Sitting-balance support: Feet supported Sitting balance-Leahy Scale: Fair     Standing balance support: Bilateral upper extremity supported, During functional activity Standing balance-Leahy Scale: Poor Standing balance comment: requires walker support to stand with some actual anxiety attached to moving  Pertinent Vitals/Pain Pain Assessment Pain Assessment: Faces Faces Pain Scale: Hurts little more Pain Location: RUE Pain Descriptors / Indicators: Grimacing, Guarding Pain Intervention(s): Monitored during session, Repositioned, Premedicated before session, Limited activity within  patient's tolerance, RN gave pain meds during session    Home Living Family/patient expects to be discharged to:: Private residence Living Arrangements: Spouse/significant other Available Help at Discharge: Family;Available PRN/intermittently Type of Home: House Home Access: Stairs to enter Entrance Stairs-Rails: Left Entrance Stairs-Number of Steps: 2   Home Layout: One level Home Equipment: Shower seat      Prior Function Prior Level of Function : Independent/Modified Independent             Mobility Comments: was up to walk with no AD, [place]       Hand Dominance   Dominant Hand: Right    Extremity/Trunk Assessment   Upper Extremity Assessment Upper Extremity Assessment: Defer to OT evaluation RUE Deficits / Details: Arm splinted to 90 degree elb flexion. RUE: Unable to fully assess due to immobilization    Lower Extremity Assessment Lower Extremity Assessment: Overall WFL for tasks assessed       Communication   Communication: No difficulties  Cognition Arousal/Alertness: Awake/alert Behavior During Therapy: WFL for tasks assessed/performed Overall Cognitive Status: Within Functional Limits for tasks assessed                                 General Comments: good historian        General Comments General comments (skin integrity, edema, etc.): assisted to chair with pillow on seat to add support and height to return to bed, with cues for body mechanics for sacral injury and propped RUE on pillow at the end of session    Exercises     Assessment/Plan    PT Assessment Patient needs continued PT services  PT Problem List Decreased strength;Decreased range of motion;Decreased activity tolerance;Decreased balance;Decreased mobility;Decreased coordination;Decreased knowledge of use of DME;Decreased skin integrity;Pain       PT Treatment Interventions DME instruction;Gait training;Stair training;Functional mobility  training;Therapeutic exercise;Therapeutic activities;Balance training;Neuromuscular re-education;Patient/family education    PT Goals (Current goals can be found in the Care Plan section)  Acute Rehab PT Goals Patient Stated Goal: to completely recover mobility and independence PT Goal Formulation: With patient Time For Goal Achievement: 05/03/23 Potential to Achieve Goals: Good    Frequency Min 5X/week     Co-evaluation               AM-PAC PT "6 Clicks" Mobility  Outcome Measure Help needed turning from your back to your side while in a flat bed without using bedrails?: A Lot Help needed moving from lying on your back to sitting on the side of a flat bed without using bedrails?: A Lot Help needed moving to and from a bed to a chair (including a wheelchair)?: A Lot Help needed standing up from a chair using your arms (e.g., wheelchair or bedside chair)?: A Lot Help needed to walk in hospital room?: A Little Help needed climbing 3-5 steps with a railing? : Total 6 Click Score: 12    End of Session Equipment Utilized During Treatment: Gait belt Activity Tolerance: Patient tolerated treatment well;Patient limited by fatigue Patient left: in chair;with call bell/phone within reach Nurse Communication: Mobility status;Precautions PT Visit Diagnosis: Unsteadiness on feet (R26.81);Muscle weakness (generalized) (M62.81);Pain;Difficulty in walking, not elsewhere classified (R26.2) Pain - Right/Left:  Right Pain - part of body: Arm    Time: 1140-1207 PT Time Calculation (min) (ACUTE ONLY): 27 min   Charges:   PT Evaluation $PT Eval Moderate Complexity: 1 Mod PT Treatments $Therapeutic Activity: 8-22 mins       Ivar Drape 04/19/2023, 4:07 PM  Samul Dada, PT PhD Acute Rehab Dept. Number: [facility] [id number] and [facility] [phone]

## 2023-04-19 NOTE — Evaluation (Signed)
Occupational Therapy Evaluation Patient Details Name: Christine Arias MRN: [id number] DOB: August 12, 1956 Today's Date: 04/19/2023   History of Present Illness 67 yo female admitted 6/3 after sustaining presyncopal event with a fall to R hip and elbow.  Received R olecranon fracture with elbow dislocation, ORIF olecranon; minimally displaced R sacral ala fracture, R superior pubic ramus and R inferior ischiopubic ramus fractures with R SI screw fixation.  PMHx: anxiety, depression, GERD, tachycardia   Clinical Impression   Pt admitted for above dx, PTA patient lived with spouse and reports being fully independent in bADLs/iADLs. Pt currently presenting with impaired RUE functional use which prompts needing assist with bADLs such as bathing and dressing and her iADLs. Educated pt on the AROM of uninvolved joints (see below) to promote joint integrity and prevent contractures. Educated pt on doffing/donning sling and demonstrated hemi dressing techniques for UBD/LBD. Pt successfully donned gown using hemi techniques but needs more assist with LBD. Pt would benefit from continued acute skilled OT services to address above deficits and help transition to next level of care. Pt would benefit from post acute Outpatient OT to progress with RUE deficits when able to complete more ROM.     Recommendations for follow up therapy are one component of a multi-disciplinary discharge planning process, led by the attending physician.  Recommendations may be updated based on patient status, additional functional criteria and insurance authorization.   Assistance Recommended at Discharge Intermittent Supervision/Assistance  Patient can return home with the following A lot of help with bathing/dressing/bathroom;Assistance with cooking/housework;Assistance with feeding;Assist for transportation    Functional Status Assessment  Patient has had a recent decline in their functional status and demonstrates the ability to make  significant improvements in function in a reasonable and predictable amount of time.  Equipment Recommendations  None recommended by OT (pt has rec DME)    Recommendations for Other Services       Precautions / Restrictions Precautions Precautions: Fall Precaution Comments: No lifting/push/pull with R arm. Ok to [employer] using platform Required Braces or Orthoses: Sling;Splint/Cast Splint/Cast: R elbow splint and sling Restrictions Weight Bearing Restrictions: Yes RLE Weight Bearing: Weight bearing as tolerated (on platform attachment) Other Position/Activity Restrictions: WBAT on LE's      Mobility Bed Mobility               General bed mobility comments: pt rec'd and left sitting in recliner    Transfers                   General transfer comment: nt      Balance                                           ADL either performed or assessed with clinical judgement   ADL Overall ADL's : Needs assistance/impaired Eating/Feeding: Sitting;Minimal assistance   Grooming: Sitting;Set up;Supervision/safety   Upper Body Bathing: Sitting;Minimal assistance   Lower Body Bathing: Sitting/lateral leans;Moderate assistance   Upper Body Dressing : Sitting;Minimal assistance Upper Body Dressing Details (indicate cue type and reason): Educated pt on hemi dressing technique Lower Body Dressing: Sitting/lateral leans;Maximal assistance Lower Body Dressing Details (indicate cue type and reason): Pt able to doff/don L sock with supervision, needs Max A for R sock Toilet Transfer: Min guard   Toileting- Clothing Manipulation and Hygiene: Min guard         General  ADL Comments: Focued session on RUE since pt just ambulated with PT.     Vision         Perception     Praxis      Pertinent Vitals/Pain Pain Assessment Pain Assessment: 0-10 Pain Score: 4  Pain Location: RUE Pain Descriptors / Indicators: Aching, Discomfort Pain Intervention(s):  Monitored during session, Repositioned     Hand Dominance Right   Extremity/Trunk Assessment Upper Extremity Assessment Upper Extremity Assessment: Defer to OT evaluation RUE Deficits / Details: Arm splinted to 90 degree elb flexion. RUE: Unable to fully assess due to immobilization   Lower Extremity Assessment Lower Extremity Assessment: Overall WFL for tasks assessed       Communication Communication Communication: No difficulties   Cognition Arousal/Alertness: Awake/alert Behavior During Therapy: WFL for tasks assessed/performed Overall Cognitive Status: Within Functional Limits for tasks assessed                                       General Comments  assisted to chair with pillow on seat to add support and height to return to bed, with cues for body mechanics for sacral injury and propped RUE on pillow at the end of session    Exercises General Exercises - Upper Extremity Shoulder Horizontal ABduction: AROM, Right, 5 reps Shoulder Horizontal ADduction: AROM, Right, 5 reps Hand Exercises Wrist Flexion: AROM, Right Wrist Extension: AROM, Right Other Exercises Other Exercises: AROM Shoulder shrugs and circles x3 reps each Other Exercises: AROM scapular protraction/retraction x3 reps   Shoulder Instructions      Home Living Family/patient expects to be discharged to:: Private residence Living Arrangements: Spouse/significant other Available Help at Discharge: Family;Available PRN/intermittently Type of Home: House Home Access: Stairs to enter [employer] of Steps: 2 Entrance Stairs-Rails: Left Home Layout: One level     Bathroom Shower/Tub: Producer, television/film/video: Standard     Home Equipment: Shower seat - built in          Prior Functioning/Environment Prior Level of Function : Independent/Modified Independent             Mobility Comments: was up to walk with no AD, renovating home at [employer] ADLs  Comments: ind        OT Problem List: Decreased strength;Impaired UE functional use;Pain      OT Treatment/Interventions: Therapeutic exercise;Self-care/ADL training;DME and/or AE instruction;Patient/family education;Therapeutic activities    OT Goals(Current goals can be found in the care plan section) Acute Rehab OT Goals Patient Stated Goal: to go home OT Goal Formulation: With patient Time For Goal Achievement: 05/03/23 ADL Goals Pt Will Perform Grooming: standing;with supervision Pt Will Perform Lower Body Bathing: sitting/lateral leans;with set-up;with supervision Pt Will Perform Upper Body Dressing: with set-up;with supervision;sitting (using hemi dressing techniques) Pt Will Perform Lower Body Dressing: sitting/lateral leans;with set-up;with supervision Pt Will Transfer to Toilet: with supervision;ambulating Additional ADL Goal #1: Pt will independently complete HEP for AROM of uninvolved joints  OT Frequency: Min 3X/week    Co-evaluation              AM-PAC OT "6 Clicks" Daily Activity     Outcome Measure Help from another person eating meals?: A Little Help from another person taking care of personal grooming?: A Little Help from another person toileting, which includes using toliet, bedpan, or urinal?: A Little Help from another person bathing (including washing, rinsing, drying)?: A  Lot Help from another person to put on and taking off regular upper body clothing?: A Little Help from another person to put on and taking off regular lower body clothing?: A Lot 6 Click Score: 16   End of Session Nurse Communication: Mobility status  Activity Tolerance: Patient tolerated treatment well Patient left: in chair;with call bell/phone within reach;with chair alarm set  OT Visit Diagnosis: Pain;Muscle weakness (generalized) (M62.81) Pain - Right/Left: Right Pain - part of body: Arm                Time: 0454-0981 OT Time Calculation (min): 19 min Charges:  OT  General Charges $OT Visit: 1 Visit OT Evaluation $OT Eval Low Complexity: 1 Low  04/19/2023  AB, OTR/L  Acute Rehabilitation Services  Office: [phone]   Tristan Schroeder 04/19/2023, 4:19 PM

## 2023-04-20 DIAGNOSIS — S42401D Unspecified fracture of lower end of right humerus, subsequent encounter for fracture with routine healing: Secondary | ICD-10-CM | POA: Diagnosis not present

## 2023-04-20 NOTE — Progress Notes (Signed)
Physical Therapy Treatment Patient Details Name: Christine Arias MRN: [id number] DOB: 19-Sep-1956 Today's Date: 04/20/2023   History of Present Illness 67 yo female admitted 6/3 after sustaining presyncopal event with a fall to R hip and elbow.  Received R olecranon fracture with elbow dislocation, ORIF olecranon; minimally displaced R sacral ala fracture, R superior pubic ramus and R inferior ischiopubic ramus fractures with R SI screw fixation.  PMHx: anxiety, depression, GERD, tachycardia    PT Comments    Updated recommendations today with pt to <3 hours a day therapy as she is unable to return home with family, will be gone for a trip upon her return.  This has impacted her expectations of support, but is clearly demonstrating improvement over last 24 hours.  Will therefore focus on standing balance, quality of gait and ability to work with observation of her precautions and limitations for movement.  Follow goals of PT on the acute PT plan of care.  Recommendations for follow up therapy are one component of a multi-disciplinary discharge planning process, led by the attending physician.  Recommendations may be updated based on patient status, additional functional criteria and insurance authorization.  Follow Up Recommendations       Assistance Recommended at Discharge Frequent or constant Supervision/Assistance  Patient can return home with the following A lot of help with walking and/or transfers;A lot of help with bathing/dressing/bathroom;Assistance with cooking/housework;Assist for transportation;Help with stairs or ramp for entrance   Equipment Recommendations  Other (comment);BSC/3in1    Recommendations for Other Services       Precautions / Restrictions Precautions Precautions: Fall Precaution Comments: No lifting/push/pull with R arm. Ok to [employer] using platform Required Braces or Orthoses: Sling;Splint/Cast Splint/Cast: R elbow splint and sling Restrictions Weight Bearing  Restrictions: Yes RUE Weight Bearing: Weight bearing as tolerated RLE Weight Bearing: Weight bearing as tolerated Other Position/Activity Restrictions: WBAT on LE's     Mobility  Bed Mobility Overal bed mobility: Needs Assistance Bed Mobility: Sit to Sidelying, Rolling Rolling: Min guard       Sit to sidelying: Min assist General bed mobility comments: walked through the sequence with pt to move from side of bed to get back in.  Pt was able to assist to return the demonstration    Transfers Overall transfer level: Needs assistance Equipment used: Rolling walker (2 wheels) Transfers: Sit to/from Stand Sit to Stand: Min assist   Step pivot transfers: Min assist       General transfer comment: standing with support of belt, using LUE on chair and placing feet to widen base    Ambulation/Gait Ambulation/Gait assistance: Min assist, Min guard Gait Distance (Feet): 60 Feet Assistive device: Rolling walker (2 wheels) Gait Pattern/deviations: Step-through pattern, Decreased stride length, Wide base of support Gait velocity: reduced Gait velocity interpretation: <1.31 ft/sec, indicative of household ambulator Pre-gait activities: standing balance and posture General Gait Details: gait was improved, walker adjusted and pt demonstrates ability to stand and maneuver walker, to avoid obstacles   Stairs             Wheelchair Mobility    Modified Rankin (Stroke Patients Only)       Balance Overall balance assessment: Needs assistance Sitting-balance support: Feet supported Sitting balance-Leahy Scale: Fair     Standing balance support: Bilateral upper extremity supported, During functional activity Standing balance-Leahy Scale: Poor Standing balance comment: requires minor help to maneuver walker even with platform support on RUE  Cognition Arousal/Alertness: Awake/alert Behavior During Therapy: WFL for tasks  assessed/performed Overall Cognitive Status: Within Functional Limits for tasks assessed                                 General Comments: Pt reports she has been up to move in room today with OT        Exercises      General Comments General comments (skin integrity, edema, etc.): Pt is up from chair and balancing better today, more aware of her use of platform functionally and taking controlled steps with walker      Pertinent Vitals/Pain Pain Assessment Pain Assessment: Faces Faces Pain Scale: Hurts little more Pain Location: R LE Pain Descriptors / Indicators: Grimacing, Guarding Pain Intervention(s): Limited activity within patient's tolerance, Monitored during session, Premedicated before session, Repositioned    Home Living                          Prior Function            PT Goals (current goals can now be found in the care plan section) Acute Rehab PT Goals Patient Stated Goal: to completely recover mobility and independence Progress towards PT goals: Progressing toward goals    Frequency    Min 5X/week      PT Plan Current plan remains appropriate    Co-evaluation              AM-PAC PT "6 Clicks" Mobility   Outcome Measure  Help needed turning from your back to your side while in a flat bed without using bedrails?: A Lot Help needed moving from lying on your back to sitting on the side of a flat bed without using bedrails?: A Lot Help needed moving to and from a bed to a chair (including a wheelchair)?: A Lot Help needed standing up from a chair using your arms (e.g., wheelchair or bedside chair)?: A Lot Help needed to walk in hospital room?: A Little Help needed climbing 3-5 steps with a railing? : Total 6 Click Score: 12    End of Session Equipment Utilized During Treatment: Gait belt Activity Tolerance: Patient tolerated treatment well;Patient limited by fatigue Patient left: in chair;with call bell/phone within  reach Nurse Communication: Mobility status;Precautions PT Visit Diagnosis: Muscle weakness (generalized) (M62.81);Pain;Difficulty in walking, not elsewhere classified (R26.2) Pain - Right/Left: Right Pain - part of body: Arm     Time: 4098-1191 PT Time Calculation (min) (ACUTE ONLY): 35 min  Charges:  $Gait Training: 8-22 mins $Therapeutic Activity: 8-22 mins         Ivar Drape 04/20/2023, 6:48 PM  Samul Dada, PT PhD Acute Rehab Dept. Number: [facility] [id number] and [facility] [phone]

## 2023-04-20 NOTE — Progress Notes (Addendum)
Occupational Therapy Treatment Patient Details Name: Christine Arias MRN: [id number] DOB: 29-Oct-1956 Today's Date: 04/20/2023   History of present illness 67 yo female admitted 6/3 after sustaining presyncopal event with a fall to R hip and elbow.  Received R olecranon fracture with elbow dislocation, ORIF olecranon; minimally displaced R sacral ala fracture, R superior pubic ramus and R inferior ischiopubic ramus fractures with R SI screw fixation.  PMHx: anxiety, depression, GERD, tachycardia   OT comments  Pt continuing to progress in OT sessions, Pt reporting cramps in R calf with continued functional mobility. Pt tolerating pain better, able to ambulate ~46ft total with min guard assist, one rest break needed due to RLE cramping. Pt reports some trouble with eating, educated pt on the use of plate guard and provided pt with one to increase self feeding capabilities. Pt does well static standing with min guard assist to complete standing bADLs at sink incorporating the RUE as a stabilizer. OT to continue to progress pt as able. DC recs updated as pt is highly motivated and making good progression in a short time span. Patient has the potential to reach Mod I and demos the ability to tolerate 3 hours of therapy. Pt would benefit from an intensive rehab program to help maximize functional independence.    Recommendations for follow up therapy are one component of a multi-disciplinary discharge planning process, led by the attending physician.  Recommendations may be updated based on patient status, additional functional criteria and insurance authorization.    Assistance Recommended at Discharge Intermittent Supervision/Assistance  Patient can return home with the following  A lot of help with bathing/dressing/bathroom;Assistance with cooking/housework;Assistance with feeding;Assist for transportation   Equipment Recommendations  None recommended by OT (pt has rec DME)    Recommendations for Other  Services Rehab consult    Precautions / Restrictions Precautions Precautions: Fall Precaution Comments: No lifting/push/pull with R arm. Ok to [employer] using platform Required Braces or Orthoses: Sling;Splint/Cast Splint/Cast: R elbow splint and sling Restrictions Weight Bearing Restrictions: No RLE Weight Bearing: Weight bearing as tolerated (on platform attachment) Other Position/Activity Restrictions: WBAT on LE's       Mobility Bed Mobility Overal bed mobility: Needs Assistance Bed Mobility: Rolling, Sidelying to Sit Rolling: Min guard Sidelying to sit: Min guard       General bed mobility comments: slow progression to move RLE to EOB    Transfers Overall transfer level: Needs assistance Equipment used: Rolling walker (2 wheels) Transfers: Sit to/from Stand Sit to Stand: Min guard           General transfer comment: STS min guard from EOB, Min A STS from low chair. Min guard transfer to recliner     Balance Overall balance assessment: Needs assistance, History of Falls Sitting-balance support: Feet supported Sitting balance-Leahy Scale: Fair     Standing balance support: Bilateral upper extremity supported, During functional activity Standing balance-Leahy Scale: Poor                             ADL either performed or assessed with clinical judgement   ADL       Grooming: Standing;Oral care;Min guard;Wash/dry face Grooming Details (indicate cue type and reason): sitting to wash/dry face due to RLE cramping                             Functional mobility during ADLs: Min guard;Rolling walker (  2 wheels) General ADL Comments: ambulated ~42ft total    Extremity/Trunk Assessment              Vision       Perception     Praxis      Cognition Arousal/Alertness: Awake/alert Behavior During Therapy: WFL for tasks assessed/performed Overall Cognitive Status: Within Functional Limits for tasks assessed                                           Exercises      Shoulder Instructions       General Comments Pt repositioned in chair, pillows underneath RUE, feet elevated    Pertinent Vitals/ Pain       Pain Assessment Pain Assessment: 0-10 Pain Score: 8  Pain Location: RLE with mobility Pain Descriptors / Indicators: Grimacing, Guarding, Cramping, Tightness Pain Intervention(s): Monitored during session, Repositioned, Limited activity within patient's tolerance  Home Living                                          Prior Functioning/Environment              Frequency  Min 3X/week        Progress Toward Goals  OT Goals(current goals can now be found in the care plan section)  Progress towards OT goals: Progressing toward goals  Acute Rehab OT Goals OT Goal Formulation: With patient Time For Goal Achievement: 05/03/23  Plan Frequency remains appropriate;Discharge plan needs to be updated    Co-evaluation                 AM-PAC OT "6 Clicks" Daily Activity     Outcome Measure   Help from another person eating meals?: A Little Help from another person taking care of personal grooming?: A Little Help from another person toileting, which includes using toliet, bedpan, or urinal?: A Little Help from another person bathing (including washing, rinsing, drying)?: A Lot Help from another person to put on and taking off regular upper body clothing?: A Little Help from another person to put on and taking off regular lower body clothing?: A Lot 6 Click Score: 16    End of Session Equipment Utilized During Treatment: Gait belt;Rolling walker (2 wheels)  OT Visit Diagnosis: Pain;Muscle weakness (generalized) (M62.81) Pain - Right/Left: Right Pain - part of body: Arm   Activity Tolerance Patient tolerated treatment well   Patient Left in chair;with call bell/phone within reach   Nurse Communication Mobility status        Time: 6045-4098 OT  Time Calculation (min): 39 min  Charges: OT General Charges $OT Visit: 1 Visit OT Treatments $Self Care/Home Management : 8-22 mins $Therapeutic Activity: 23-37 mins  04/20/2023  AB, OTR/L  Acute Rehabilitation Services  Office: [phone]   Tristan Schroeder 04/20/2023, 11:19 AM

## 2023-04-20 NOTE — Progress Notes (Signed)
   04/19/23 2100  BiPAP/CPAP/SIPAP  BiPAP/CPAP/SIPAP Pt Type Adult  Reason BIPAP/CPAP not in use  (Pt refused bipap/cpap)  Patient Home Equipment No  BiPAP/CPAP /SiPAP Vitals  SpO2 98 %  Bilateral Breath Sounds Clear;Diminished

## 2023-04-20 NOTE — Progress Notes (Signed)
Orthopaedic Trauma Service Progress Note  Patient ID: PHEOBE BAYMON MRN: [id number] DOB/AGE: February 10, 1956 67 y.o.  Subjective:  Feeling better  Still apprehensive with R leg and weightbearing  Feels biggest hurdle to going home is the 2 stairs to get in the house. Once she can do this she will feel comfortable going home   No other issues     ROS As above  Objective:   VITALS:   Vitals:   04/19/23 2050 04/19/23 2100 04/20/23 0340 04/20/23 0824  BP: 123/67  137/73 (!) 140/78  Pulse: 72  75 72  Resp: 19  17 17   Temp: 98 F (36.7 C)  98.2 F (36.8 C) 98.4 F (36.9 C)  TempSrc: Oral  Oral Oral  SpO2: 98% 98% 97% 98%  Weight:      Height:        Estimated body mass index is 30.11 kg/m as calculated from the following:   Height as of this encounter: 5\' 3"  (1.6 m).   Weight as of this encounter: 77.1 kg.   Intake/Output      06/05 0701 06/06 0700 06/06 0701 06/07 0700   P.O. 408 240   I.V. (mL/kg)     IV Piggyback     Total Intake(mL/kg) 408 (5.3) 240 (3.1)   Urine (mL/kg/hr) 1200 (0.6) 300 (0.7)   Emesis/NG output 0 0   Other 0 0   Stool 0 0   Blood 0 0   Total Output 1200 300   Net -792 -60        Urine Occurrence 0 x 0 x   Stool Occurrence 0 x 0 x   Emesis Occurrence 0 x 0 x     LABS  No results found for this or any previous visit (from the past 24 hour(s)).   PHYSICAL EXAM:   Gen: awake, alert, very pleasant  Lungs: unlabored Cardiac: reg Ext:       Right Upper Extremtiy              Splint clean and intact             Ext warm              Minimal swelling              Sling in place             Brisk cap refill             Motor and sensory functions intact                   Right Lower Extremity              Dressing R flank clean              EHL and ankle extension intact             DPN sensation intact             Exam o/w stable and unremarkable      Assessment/Plan: 2 Days Post-Op     Anti-infectives (From admission, onward)    Start     Dose/Rate Route Frequency Ordered Stop   04/18/23 1730  ceFAZolin (ANCEF) IVPB 2g/100 mL premix        2 g 200  mL/hr over 30 Minutes Intravenous Every 8 hours 04/18/23 1632 04/19/23 0546   04/18/23 1000  ceFAZolin (ANCEF) IVPB 2g/100 mL premix  Status:  Discontinued        2 g 200 mL/hr over 30 Minutes Intravenous On call to O.R. 04/18/23 0947 04/18/23 1628   04/18/23 0949  ceFAZolin (ANCEF) 2-4 GM/100ML-% IVPB       Note to Pharmacy: Shanda Bumps M: cabinet override      04/18/23 0949 04/18/23 2159     .  POD/HD#: 2  Fall with R olecranon fracture/elbow dislocation, R pelvic ring fracture   -R olecranon fracture/elbow dislocation s/p ORIF R olecranon  Weightbearing No lifting with R arm  Ok to [employer] using platform                ROM/Activity                         No elbow motion yet as she is splinted                         Will start gentle motion in about 1 week                Wound care                         Splint x 1 week then start regular wound care    - R pelvic ring fracture s/p SI screw              WBAT R leg with walker              No motion restrictions              Therapy evals    - Pain management:             Multimodal    - ABL anemia/Hemodynamics             Stable   - Medical issues              Per primary    - DVT/PE prophylaxis:             Lovenox while inpatient              Will likely transition to eliquis 2.5 mg po BID x 30 days upon dc    - ID:              Periop abx completed    - Metabolic Bone Disease:             Vitamin d insufficiency                          Supplement    - Activity:             As above     - Dispo:             Therapy evals             Pt really wants to dc home               Will need to be cleared on stairs before she can dc home    Do not think she needs a SNF   [name],  PA-C [phone] (C) 04/20/2023, 12:43 PM  [facility] [phone]  [address] [place] [address] [phone] Collier Bullock (F)    After 5pm and on the weekends please log on to Amion, go to orthopaedics and the look under the [facility] Call for the provider(s) on call. You can also call our office at [phone] and then follow the prompts to be connected to the call team.  Patient ID: Jodean Lima, female   DOB: 05-Jun-1956, 67 y.o.   MRN: [id number]

## 2023-04-20 NOTE — TOC Initial Note (Signed)
Transition of Care [facility]) - Initial/Assessment Note    Patient Details  Name: Christine Arias MRN: [id number] Date of Birth: 1956/09/30  [facility]) CM/SW Contact:    Epifanio Lesches, RN Phone Number: 04/20/2023, 3:20 PM  Clinical Narrative:                 - s/p a fall, suffered  closed fracture dislocation of right elbow joint and closed fracture of right pelvis. From home with husband. PTA independent with ADL's , no DME usage. NCM received consult for possible SNF placement at time of discharge.NCM spoke with patient regarding PT recommendation of SNF placement at time of discharge. Patient reported that patient's spouse is currently unable to care for patient at their home given patient's current physical needs and fall risk. Husband will be working and will not be able to assist with care. Patient expressed understanding of PT recommendation and is agreeable to SNF placement at time of discharge. Patient without  preference for  SNF. NCM discussed insurance authorization process and provided Medicare SNF ratings list. Patient expressed being hopeful for rehab and to feel better soon. No further questions reported at this time. NCM to continue to follow and assist with discharge planning needs.   Expected Discharge Plan: Skilled Nursing Facility Barriers to Discharge: Continued Medical Work up   Patient Goals and CMS Choice     Choice offered to / list presented to : Patient      Expected Discharge Plan and Services       Living arrangements for the past 2 months: Single Family Home                                      Prior Living Arrangements/Services Living arrangements for the past 2 months: Single Family Home Lives with:: Spouse Patient language and need for interpreter reviewed:: Yes Do you feel safe going back to the place where you live?: Yes      Need for Family Participation in Patient Care: Yes (Comment) Care giver support system in  place?: No (comment)   Criminal Activity/Legal Involvement Pertinent to Current Situation/Hospitalization: No - Comment as needed  Activities of Daily Living Home Assistive Devices/Equipment: None ADL Screening (condition at time of admission) Patient's cognitive ability adequate to safely complete daily activities?: Yes Is the patient deaf or have difficulty hearing?: No Does the patient have difficulty seeing, even when wearing glasses/contacts?: No Does the patient have difficulty concentrating, remembering, or making decisions?: No Patient able to express need for assistance with ADLs?: Yes Does the patient have difficulty dressing or bathing?: No Independently performs ADLs?: No Communication: Independent Dressing (OT): Needs assistance Is this a change from baseline?: Change from baseline, expected to last <3days Feeding: Independent Bathing: Needs assistance Is this a change from baseline?: Change from baseline, expected to last <3 days Toileting: Needs assistance Is this a change from baseline?: Change from baseline, expected to last <3 days In/Out Bed: Needs assistance Is this a change from baseline?: Change from baseline, expected to last <3 days Walks in Home: Needs assistance Is this a change from baseline?: Change from baseline, expected to last <3 days Does the patient have difficulty walking or climbing stairs?: Yes Weakness of Legs: None Weakness of Arms/Hands: None  Permission Sought/Granted   Permission granted to share information with : Yes, Verbal Permission Granted  Emotional Assessment Appearance:: Appears stated age Attitude/Demeanor/Rapport: Engaged Affect (typically observed): Accepting Orientation: : Oriented to Self, Oriented to Place, Oriented to  Time, Oriented to Situation Alcohol / Substance Use: Not Applicable Psych Involvement: No (comment)  Admission diagnosis:  Closed fracture dislocation of right elbow [S42.401A] Near  syncope [R55] Patient Active Problem List   Diagnosis Date Noted   OSA on CPAP 04/17/2023   Closed fracture dislocation of right elbow 04/17/2023   Near syncope 04/17/2023   Closed fracture dislocation of right elbow joint, initial encounter 04/16/2023   Closed fracture of right pelvis, initial encounter (HCC) 04/16/2023   Lightheadedness/presyncope 04/16/2023   History of melanoma 12/19/2014   History of diverticulitis 12/19/2014   Family history of colonic polyps 12/19/2014   Breast mass 12/10/2013   Fibrocystic breast disease 12/10/2013   History of diverticulitis of colon    Melanoma (HCC)    PCP:  System, Provider Not In Pharmacy:   [id number] - [place], [place] - [address] [place] [address] Phone: [phone] Fax: [phone]  [id number] [place], [place] - [address] AT [facility] OF [address] [facility] [address] [place] [address] Phone: [phone] Fax: [phone]  [id number], [place] - [address] AT [facility] OF [address] & [place] [address] [facility] [place] [address] Phone: [phone] Fax: [phone]     Social Determinants of Health (SDOH) Social History: SDOH Screenings   Food Insecurity: No Food Insecurity (04/17/2023)  Housing: Low Risk  (04/17/2023)  Transportation Needs: No Transportation Needs (04/17/2023)  Utilities: Not At Risk (04/17/2023)  Tobacco Use: Low Risk  (04/18/2023)   SDOH Interventions:     Readmission Risk Interventions     No data to display

## 2023-04-20 NOTE — Care Management Important Message (Signed)
Important Message  Patient Details  Name: NILA STAIRS MRN: [id number] Date of Birth: 18-Apr-1956   Medicare Important Message Given:  Yes     Sherilyn Banker 04/20/2023, 12:35 PM

## 2023-04-20 NOTE — Progress Notes (Signed)
Christine Arias  ZOX:[id number] DOB: 1956/02/08 DOA: 04/17/2023 PCP: System, Provider Not In    Brief Narrative:  67 year old with a history of anxiety/depression and GERD who suffered a fall with resultant elbow and pelvic pain.  CT of the right elbow revealed a distal humerus fracture with anterior radial head dislocation/subluxation.  CT of the pelvis revealed right pubic rami fractures and a minimally displaced right sacral alla.  Consultants:  Orthopedic Surgery  Goals of Care:  Code Status: Full Code   DVT prophylaxis: Lovenox  Interim Hx: No acute events reported overnight.  Chose not to wear CPAP last night, for the second night in a row.  Afebrile.  Vital signs stable.  Doing well overall.  No new complaints today.  Is having difficulty with ambulation due to pain in the pelvis which limits the use of her right leg.  Also complains of some cramping in the right calf.  Assessment & Plan:  Right elbow/distal humerus fracture Underwent ORIF per Orthopedic Surgery 04/18/2023 -postoperative care per Orthopedics -appears she will need a SNF rehab stay  Pelvic fracture Orthopedics felt that repeat imaging actually revealed a fracture all the way through the ala in addition to the more obvious anterior displaced buckle fragment - now s/p sacroiliac screw fixation 04/18/23 - postoperative care per Orthopedics  Near syncope This preceded the patient's fall - she had just completed a 3-hour trip [place] from the [place] - PE and DVT ruled out - no findings on telemetry monitoring - TTE unrevealing - has a remote history of paroxysmal tachycardia therefore if symptoms recur could consider event monitor -no further inpatient workup indicated at this time  Chronic OSA Has chosen not to utilize CPAP during her inpatient stay  Family Communication: No family present at time of exam Disposition: Medically stable for discharge when cleared by Orthopedic Surgery -Will need SNF rehab stay versus  discharge home if she can improve right lower extremity mobility to the point she can navigate stairs   Objective: Blood pressure (!) 124/47, pulse 80, temperature 98.7 F (37.1 C), temperature source Oral, resp. rate 16, height 5\' 3"  (1.6 m), weight 77.1 kg, SpO2 98 %.  Intake/Output Summary (Last 24 hours) at 04/20/2023 1739 Last data filed at 04/20/2023 1600 Gross per 24 hour  Intake 480 ml  Output 1100 ml  Net -620 ml   Filed Weights   04/17/23 1700 04/18/23 0947  Weight: 81.6 kg 77.1 kg    Examination: General: No acute respiratory distress Lungs: Clear to auscultation bilaterally without wheezes or crackles Cardiovascular: Regular rate and rhythm without murmur gallop or rub normal S1 and S2 Abdomen: Nontender, nondistended, soft, bowel sounds positive, no rebound, no ascites, no appreciable mass Extremities: No significant edema B LE  CBC: Recent Labs  Lab 04/16/23 1655 04/18/23 0215 04/19/23 0217  WBC 8.7 9.3 9.9  HGB 11.9* 11.4* 10.8*  HCT 36.3 33.3* 32.7*  MCV 94.8 94.1 96.2  PLT 225 154 165   Basic Metabolic Panel: Recent Labs  Lab 04/16/23 1655  NA 140  K 3.5  CL 105  CO2 27  GLUCOSE 116*  BUN 20  CREATININE 0.73  CALCIUM 9.2   GFR: Estimated Creatinine Clearance: 65.3 mL/min (by C-G formula based on SCr of 0.73 mg/dL).   Scheduled Meds:  acetaminophen  650 mg Oral Q8H   busPIRone  10 mg Oral TID   Chlorhexidine Gluconate Cloth  6 each Topical Daily   enoxaparin (LOVENOX) injection  40 mg Subcutaneous  Q24H   escitalopram  10 mg Oral Daily   mupirocin ointment  1 Application Nasal BID   pantoprazole  40 mg Oral BID   rOPINIRole  3 mg Oral QHS   rosuvastatin  10 mg Oral Daily   sucralfate  1 g Oral BID     LOS: 3 days   Lonia Blood, MD [facility] Office  [phone] Pager - Text Page per Loretha Stapler  If 7PM-7AM, please contact night-coverage per Amion 04/20/2023, 5:39 PM

## 2023-04-20 NOTE — Progress Notes (Signed)
  Inpatient Rehab Admissions Coordinator :  Per OT therapy change in recommendations patient was screened for CIR candidacy by Ottie Glazier RN MSN. Dr Riley Kill reviewed. Patient does not appear to demonstrate the medical neccesity for a Hospital Rehabilitation /CIR admit. I will not place a Rehab Consult. Recommend other Rehab Venues to be pursued. Please contact me with any questions.  Ottie Glazier RN MSN Admissions Coordinator [phone]

## 2023-04-20 NOTE — Plan of Care (Signed)

## 2023-04-21 DIAGNOSIS — S42401D Unspecified fracture of lower end of right humerus, subsequent encounter for fracture with routine healing: Secondary | ICD-10-CM | POA: Diagnosis not present

## 2023-04-21 LAB — CBC
HCT: 31.2 % — ABNORMAL LOW (ref 36.0–46.0)
Hemoglobin: 10.6 g/dL — ABNORMAL LOW (ref 12.0–15.0)
MCH: 32.4 pg (ref 26.0–34.0)
MCHC: 34 g/dL (ref 30.0–36.0)
MCV: 95.4 fL (ref 80.0–100.0)
Platelets: 206 10*3/uL (ref 150–400)
RBC: 3.27 MIL/uL — ABNORMAL LOW (ref 3.87–5.11)
RDW: 13.5 % (ref 11.5–15.5)
WBC: 7.3 10*3/uL (ref 4.0–10.5)
nRBC: 0 % (ref 0.0–0.2)

## 2023-04-21 LAB — BASIC METABOLIC PANEL
Anion gap: 8 (ref 5–15)
BUN: 8 mg/dL (ref 8–23)
CO2: 26 mmol/L (ref 22–32)
Calcium: 9 mg/dL (ref 8.9–10.3)
Chloride: 106 mmol/L (ref 98–111)
Creatinine, Ser: 0.64 mg/dL (ref 0.44–1.00)
GFR, Estimated: >60 mL/min (ref >60)
Glucose, Bld: 99 mg/dL (ref 70–99)
Potassium: 3.5 mmol/L (ref 3.5–5.1)
Sodium: 140 mmol/L (ref 135–145)

## 2023-04-21 LAB — MAGNESIUM: Magnesium: 2 mg/dL (ref 1.7–2.4)

## 2023-04-21 LAB — PHOSPHORUS: Phosphorus: 3.2 mg/dL (ref 2.5–4.6)

## 2023-04-21 MED ORDER — VITAMIN D 25 MCG (1000 UNIT) PO TABS
2000.0000 [IU] | ORAL_TABLET | Freq: Two times a day (BID) | ORAL | Status: DC
  Administered 2023-04-21 – 2023-04-24 (×7): 2000 [IU] via ORAL
  Filled 2023-04-21 (×7): qty 2

## 2023-04-21 MED ORDER — APIXABAN 2.5 MG PO TABS: 2.5000 mg | ORAL_TABLET | Freq: Two times a day (BID) | ORAL | Status: DC

## 2023-04-21 MED ORDER — OXYCODONE-ACETAMINOPHEN 7.5-325 MG PO TABS: 1.0000 | ORAL_TABLET | Freq: Three times a day (TID) | ORAL | 0 refills | Status: AC | PRN

## 2023-04-21 MED ORDER — APIXABAN 2.5 MG PO TABS
2.5000 mg | ORAL_TABLET | Freq: Two times a day (BID) | ORAL | Status: DC
  Administered 2023-04-21 – 2023-04-24 (×6): 2.5 mg via ORAL
  Filled 2023-04-21 (×6): qty 1

## 2023-04-21 MED ORDER — VITAMIN D 125 MCG (5000 UT) PO CAPS: 1.0000 | ORAL_CAPSULE | Freq: Every day | ORAL | 6 refills | Status: AC

## 2023-04-21 MED ORDER — ACETAMINOPHEN 325 MG PO TABS: 650.0000 mg | ORAL_TABLET | Freq: Three times a day (TID) | ORAL | 0 refills | Status: AC | PRN

## 2023-04-21 MED ORDER — APIXABAN 2.5 MG PO TABS: 2.5000 mg | ORAL_TABLET | Freq: Two times a day (BID) | ORAL | 0 refills | Status: AC

## 2023-04-21 NOTE — Progress Notes (Signed)
Orthopaedic Trauma Service Progress Note  Patient ID: Christine Arias MRN: [id number] DOB/AGE: December 08, 1955 67 y.o.  Subjective:  Doing better but feels she would benefit from short term SNF No other issues of note + void + flatus  + BM  Good appetite    ROS As above  Objective:   VITALS:   Vitals:   04/20/23 1449 04/20/23 2036 04/21/23 0500 04/21/23 0740  BP: (!) 124/47 124/62 (!) 140/85 (!) 143/72  Pulse: 80 77 81 72  Resp: 16 18 19 18   Temp: 98.7 F (37.1 C) 98 F (36.7 C) 98.2 F (36.8 C) 98.7 F (37.1 C)  TempSrc: Oral Oral Oral Oral  SpO2: 98% 99% 98% 98%  Weight:      Height:        Estimated body mass index is 30.11 kg/m as calculated from the following:   Height as of this encounter: 5\' 3"  (1.6 m).   Weight as of this encounter: 77.1 kg.   Intake/Output      06/06 0701 06/07 0700 06/07 0701 06/08 0700   P.O. 480    Total Intake(mL/kg) 480 (6.2)    Urine (mL/kg/hr) 900 (0.5)    Emesis/NG output 0    Other 0    Stool 0    Blood 0    Total Output 900    Net -420         Urine Occurrence 1 x    Stool Occurrence 1 x    Emesis Occurrence 0 x      LABS  Results for orders placed or performed during the hospital encounter of 04/17/23 (from the past 24 hour(s))  Basic metabolic panel     Status: None   Collection Time: 04/21/23  8:10 AM  Result Value Ref Range   Sodium 140 135 - 145 mmol/L   Potassium 3.5 3.5 - 5.1 mmol/L   Chloride 106 98 - 111 mmol/L   CO2 26 22 - 32 mmol/L   Glucose, Bld 99 70 - 99 mg/dL   BUN 8 8 - 23 mg/dL   Creatinine, Ser 7.82 0.44 - 1.00 mg/dL   Calcium 9.0 8.9 - 95.6 mg/dL   GFR, Estimated >21 >30 mL/min   Anion gap 8 5 - 15  CBC     Status: Abnormal   Collection Time: 04/21/23  8:10 AM  Result Value Ref Range   WBC 7.3 4.0 - 10.5 K/uL   RBC 3.27 (L) 3.87 - 5.11 MIL/uL   Hemoglobin 10.6 (L) 12.0 - 15.0 g/dL   HCT 86.5 (L) 78.4 - 69.6 %    MCV 95.4 80.0 - 100.0 fL   MCH 32.4 26.0 - 34.0 pg   MCHC 34.0 30.0 - 36.0 g/dL   RDW 29.5 28.4 - 13.2 %   Platelets 206 150 - 400 K/uL   nRBC 0.0 0.0 - 0.2 %  Magnesium     Status: None   Collection Time: 04/21/23  8:10 AM  Result Value Ref Range   Magnesium 2.0 1.7 - 2.4 mg/dL  Phosphorus     Status: None   Collection Time: 04/21/23  8:10 AM  Result Value Ref Range   Phosphorus 3.2 2.5 - 4.6 mg/dL     PHYSICAL EXAM:   Gen: awake, alert, very pleasant, resting comfortably in bed, NAD  Lungs: unlabored Cardiac: reg Ext:       Right Upper Extremtiy              Splint clean and intact             Ext warm              Minimal swelling              Sling in place             Brisk cap refill             Motor and sensory functions intact                   Right Lower Extremity              Dressing R flank clean              EHL and ankle extension intact             DPN sensation intact                 Assessment/Plan: 3 Days Post-Op     Anti-infectives (From admission, onward)    Start     Dose/Rate Route Frequency Ordered Stop   04/18/23 1730  ceFAZolin (ANCEF) IVPB 2g/100 mL premix        2 g 200 mL/hr over 30 Minutes Intravenous Every 8 hours 04/18/23 1632 04/19/23 0546   04/18/23 1000  ceFAZolin (ANCEF) IVPB 2g/100 mL premix  Status:  Discontinued        2 g 200 mL/hr over 30 Minutes Intravenous On call to O.R. 04/18/23 0947 04/18/23 1628   04/18/23 0949  ceFAZolin (ANCEF) 2-4 GM/100ML-% IVPB       Note to Pharmacy: Shanda Bumps M: cabinet override      04/18/23 0949 04/18/23 2159     .  POD/HD#: 3  Fall with R olecranon fracture/elbow dislocation, R pelvic ring fracture   -R olecranon fracture/elbow dislocation s/p ORIF R olecranon  Weightbearing No lifting with R arm  Ok to [employer] using platform                ROM/Activity                         No elbow motion yet as she is splinted                         Will start gentle motion in  about 1 week                Wound care                         Splint x 1 week then start regular wound care    - R pelvic ring fracture s/p SI screw              WBAT R leg with walker              No motion restrictions              Therapy evals    - Pain management:             Multimodal    - ABL anemia/Hemodynamics             Stable   - Medical issues  Per primary    - DVT/PE prophylaxis:             transition to eliquis tomorrow    Eliquis x 30 days    - ID:              Periop abx completed    - Metabolic Bone Disease:             Vitamin d insufficiency                          Supplement    - Activity:             As above     - Dispo:             Therapies  Ortho issues stable  Follow up with ortho in 10 days  Stable for dc to snf   [name], PA-C [phone] (C) 04/21/2023, 1:24 PM  [facility] [address] [place] [address] [phone] Collier Bullock (F)    After 5pm and on the weekends please log on to Amion, go to orthopaedics and the look under the [facility] Call for the provider(s) on call. You can also call our office at [phone] and then follow the prompts to be connected to the call team.  Patient ID: Christine Arias, female   DOB: 12-01-1955, 67 y.o.   MRN: [id number]

## 2023-04-21 NOTE — Progress Notes (Signed)
Christine OLA  Arias:[id number] DOB: 04-06-1956 DOA: 04/17/2023 PCP: System, Provider Not In    Brief Narrative:  67 year old with a history of anxiety/depression and GERD who suffered a fall with resultant elbow and pelvic pain.  CT of the right elbow revealed a distal humerus fracture with anterior radial head dislocation/subluxation.  CT of the pelvis revealed right pubic rami fractures and a minimally displaced right sacral alla.  Consultants:  Orthopedic Surgery  Goals of Care:  Code Status: Full Code   DVT prophylaxis: Lovenox  Interim Hx: No acute events recorded overnight.  Afebrile.  Vital signs stable.  No new complaints today.  Continues to have significant pain in her right pelvis and leg which makes ambulation difficult.  No shortness of breath or chest pain.  Good appetite.  Assessment & Plan:  Right elbow/distal humerus fracture Underwent ORIF per Orthopedic Surgery 04/18/2023 -postoperative care per Orthopedics -appears she will need a SNF rehab stay  Pelvic fracture Orthopedics felt that repeat imaging actually revealed a fracture all the way through the ala in addition to the more obvious anterior displaced buckle fragment - now s/p sacroiliac screw fixation 04/18/23 - postoperative care per Orthopedics  Near syncope This preceded the patient's fall - she had just completed a 3-[place] trip [place] from [place] - PE and DVT ruled out - no findings on telemetry monitoring - TTE unrevealing - has a remote history of paroxysmal tachycardia therefore if symptoms recur could consider event monitor -no further inpatient workup indicated at this time  Chronic OSA Has chosen not to utilize CPAP during her inpatient stay  Family Communication: No family present at time of exam Disposition: Medically stable for discharge - will need SNF rehab stay versus discharge home if she can improve right lower extremity mobility to the point she can navigate stairs   Objective: Blood  pressure (!) 143/72, pulse 72, temperature 98.7 F (37.1 C), temperature source Oral, resp. rate 18, height 5\' 3"  (1.6 m), weight 77.1 kg, SpO2 98 %.  Intake/Output Summary (Last 24 hours) at 04/21/2023 1009 Last data filed at 04/21/2023 0500 Gross per 24 hour  Intake 240 ml  Output 800 ml  Net -560 ml    Filed Weights   04/17/23 1700 04/18/23 0947  Weight: 81.6 kg 77.1 kg    Examination: General: No acute respiratory distress Lungs: Clear to auscultation bilaterally without wheezes or crackles Cardiovascular: RRR w/o M or R Abdomen: NT/ND, soft, bs+, no mass  Extremities: No significant edema B LE  CBC: Recent Labs  Lab 04/18/23 0215 04/19/23 0217 04/21/23 0810  WBC 9.3 9.9 7.3  HGB 11.4* 10.8* 10.6*  HCT 33.3* 32.7* 31.2*  MCV 94.1 96.2 95.4  PLT 154 165 206    Basic Metabolic Panel: Recent Labs  Lab 04/16/23 1655 04/21/23 0810  NA 140 140  K 3.5 3.5  CL 105 106  CO2 27 26  GLUCOSE 116* 99  BUN 20 8  CREATININE 0.73 0.64  CALCIUM 9.2 9.0  MG  --  2.0  PHOS  --  3.2    GFR: Estimated Creatinine Clearance: 65.3 mL/min (by C-G formula based on SCr of 0.64 mg/dL).   Scheduled Meds:  acetaminophen  650 mg Oral Q8H   busPIRone  10 mg Oral TID   Chlorhexidine Gluconate Cloth  6 each Topical Daily   enoxaparin (LOVENOX) injection  40 mg Subcutaneous Q24H   escitalopram  10 mg Oral Daily   mupirocin ointment  1 Application  Nasal BID   pantoprazole  40 mg Oral BID   rOPINIRole  3 mg Oral QHS   rosuvastatin  10 mg Oral Daily   sucralfate  1 g Oral BID     LOS: 4 days   Lonia Blood, MD [facility] Office  [phone] Pager - Text Page per Loretha Stapler  If 7PM-7AM, please contact night-coverage per Amion 04/21/2023, 10:09 AM

## 2023-04-21 NOTE — Discharge Instructions (Signed)
Orthopaedic Trauma Service Discharge Instructions   General Discharge Instructions  Orthopaedic Injuries:  Right olecranon fracture (elbow) these an elbow dislocation treated with open reduction and internal fixation using plate and screws  Right pelvic ring fracture treated with percutaneous fixation using sacroiliac screw  WEIGHT BEARING STATUS:       Weight-bear as tolerated right leg with assistance      No lifting with right arm.  Okay to use a platform walker        RANGE OF MOTION/ACTIVITY:  No active elbow extension and no elbow extension against resistance      Active and passive elbow flexion is okay.  No restrictions with forearm, wrist or hand motion  No restrictions with hip or knee motion on right leg  Slowly increase activity level  Bone health: Labs show mild vitamin D insufficiency.  Please supplement with vitamin D3 5000 IUs daily  Review the following resource for additional information regarding bone health  [email or web address]  Wound Care: Daily wound care as needed.  Please see below   Discharge Wound Care Instructions  Do NOT apply any ointments, solutions or lotions to pin sites or surgical wounds.  These prevent needed drainage and even though solutions like hydrogen peroxide kill bacteria, they also damage cells lining the pin sites that help fight infection.  Applying lotions or ointments can keep the wounds moist and can cause them to breakdown and open up as well. This can increase the risk for infection. When in doubt call the office.  Surgical incisions should be dressed daily.  If any drainage is noted, use one layer of adaptic or Mepitel, then gauze, Kerlix, and an ace wrap.  [email or web address]  These dressing supplies should be available at  local medical supply stores ([facility], [facility], etc). They are not usually carried at places like [facility], [facility], [facility], etc  Once the incision is completely dry and without drainage, it may be left open to air out.  Showering may begin 36-48 hours later.  Cleaning gently with soap and water.   DVT/PE prophylaxis: Eliquis 2.5 mg every 12 hours for 30 days postoperatively for blood clot prevention  Diet: as you were eating previously.  Can use over the counter stool softeners and bowel preparations, such as Miralax, to help with bowel movements.  Narcotics can be constipating.  Be sure to drink plenty of fluids  PAIN MEDICATION USE AND EXPECTATIONS  You have likely been given narcotic medications to help control your pain.  After a traumatic event that results in an fracture (broken bone) with or without surgery, it is ok to use narcotic pain medications to help control one's pain.  We understand that everyone responds to pain differently and each individual patient will be evaluated on a regular basis for the continued need for narcotic medications. Ideally, narcotic medication use should last no more than 6-8 weeks (coinciding with fracture healing).   As a patient it is your responsibility as well to monitor narcotic medication use and report the amount and frequency you use these medications when you come to your office visit.   We would also advise that if you are using narcotic medications, you should take a dose prior to therapy to maximize you participation.  IF YOU ARE ON NARCOTIC MEDICATIONS IT IS NOT PERMISSIBLE TO OPERATE A MOTOR VEHICLE (MOTORCYCLE/CAR/TRUCK/MOPED) OR HEAVY MACHINERY DO NOT MIX NARCOTICS WITH OTHER CNS (CENTRAL NERVOUS SYSTEM) DEPRESSANTS SUCH AS ALCOHOL   POST-OPERATIVE OPIOID TAPER INSTRUCTIONS: It  is important to wean off of your opioid medication as soon as possible. If you do not need pain medication after your surgery it is ok to stop day  one. Opioids include: Codeine, Hydrocodone(Norco, Vicodin), Oxycodone(Percocet, oxycontin) and hydromorphone amongst others.  Long term and even short term use of opiods can cause: Increased pain response Dependence Constipation Depression Respiratory depression And more.  Withdrawal symptoms can include Flu like symptoms Nausea, vomiting And more Techniques to manage these symptoms Hydrate well Eat regular healthy meals Stay active Use relaxation techniques(deep breathing, meditating, yoga) Do Not substitute Alcohol to help with tapering If you have been on opioids for less than two weeks and do not have pain than it is ok to stop all together.  Plan to wean off of opioids This plan should start within one week post op of your fracture surgery  Maintain the same interval or time between taking each dose and first decrease the dose.  Cut the total daily intake of opioids by one tablet each day Next start to increase the time between doses. The last dose that should be eliminated is the evening dose.    STOP SMOKING OR USING NICOTINE PRODUCTS!!!!  As discussed nicotine severely impairs your body's ability to heal surgical and traumatic wounds but also impairs bone healing.  Wounds and bone heal by forming microscopic blood vessels (angiogenesis) and nicotine is a vasoconstrictor (essentially, shrinks blood vessels).  Therefore, if vasoconstriction occurs to these microscopic blood vessels they essentially disappear and are unable to deliver necessary nutrients to the healing tissue.  This is one modifiable factor that you can do to dramatically increase your chances of healing your injury.    (This means no smoking, no nicotine gum, patches, etc)  DO NOT USE NONSTEROIDAL ANTI-INFLAMMATORY DRUGS (NSAID'S)  Using products such as Advil (ibuprofen), Aleve (naproxen), Motrin (ibuprofen) for additional pain control during fracture healing can delay and/or prevent the healing response.  If  you would like to take over the counter (OTC) medication, Tylenol (acetaminophen) is ok.  However, some narcotic medications that are given for pain control contain acetaminophen as well. Therefore, you should not exceed more than 4000 mg of tylenol in a day if you do not have liver disease.  Also note that there are may OTC medicines, such as cold medicines and allergy medicines that my contain tylenol as well.  If you have any questions about medications and/or interactions please ask your doctor/PA or your pharmacist.      ICE AND ELEVATE INJURED/OPERATIVE EXTREMITY  Using ice and elevating the injured extremity above your heart can help with swelling and pain control.  Icing in a pulsatile fashion, such as 20 minutes on and 20 minutes off, can be followed.    Do not place ice directly on skin. Make sure there is a barrier between to skin and the ice pack.    Using frozen items such as frozen peas works well as the conform nicely to the are that needs to be iced.  USE AN ACE WRAP OR TED HOSE FOR SWELLING CONTROL  In addition to icing and elevation, Ace wraps or TED hose are used to help limit and resolve swelling.  It is recommended to use Ace wraps or TED hose until you are informed to stop.    When using Ace Wraps start the wrapping distally (farthest away from the body) and wrap proximally (closer to the body)   Example: If you had surgery on your leg or thing  and you do not have a splint on, start the ace wrap at the toes and work your way up to the thigh        If you had surgery on your upper extremity and do not have a splint on, start the ace wrap at your fingers and work your way up to the upper arm  IF YOU ARE IN A SPLINT OR CAST DO NOT REMOVE IT FOR ANY REASON   If your splint gets wet for any reason please contact the office immediately. You may shower in your splint or cast as long as you keep it dry.  This can be done by wrapping in a cast cover or garbage back (or similar)  Do Not  stick any thing down your splint or cast such as pencils, money, or hangers to try and scratch yourself with.  If you feel itchy take benadryl as prescribed on the bottle for itching  IF YOU ARE IN A CAM BOOT (BLACK BOOT)  You may remove boot periodically. Perform daily dressing changes as noted below.  Wash the liner of the boot regularly and wear a sock when wearing the boot. It is recommended that you sleep in the boot until told otherwise    Call office for the following: Temperature greater than 101F Persistent nausea and vomiting Severe uncontrolled pain Redness, tenderness, or signs of infection (pain, swelling, redness, odor or green/yellow discharge around the site) Difficulty breathing, headache or visual disturbances Hives Persistent dizziness or light-headedness Extreme fatigue Any other questions or concerns you may have after discharge  In an emergency, call 911 or go to an Emergency Department at a nearby hospital  HELPFUL INFORMATION  If you had a block, it will wear off between 8-24 hrs postop typically.  This is period when your pain may go from nearly zero to the pain you would have had postop without the block.  This is an abrupt transition but nothing dangerous is happening.  You may take an extra dose of narcotic when this happens.  You should wean off your narcotic medicines as soon as you are able.  Most patients will be off or using minimal narcotics before their first postop appointment.   We suggest you use the pain medication the first night prior to going to bed, in order to ease any pain when the anesthesia wears off. You should avoid taking pain medications on an empty stomach as it will make you nauseous.  Do not drink alcoholic beverages or take illicit drugs when taking pain medications.  In most states it is against the law to drive while you are in a splint or sling.  And certainly against the law to drive while taking narcotics.  You may return to  work/school in the next couple of days when you feel up to it.   Pain medication may make you constipated.  Below are a few solutions to try in this order: Decrease the amount of pain medication if you aren't having pain. Drink lots of decaffeinated fluids. Drink prune juice and/or each dried prunes  If the first 3 don't work start with additional solutions Take Colace - an over-the-counter stool softener Take Senokot - an over-the-counter laxative Take Miralax - a stronger over-the-counter laxative     CALL THE OFFICE WITH ANY QUESTIONS OR CONCERNS: [phone]   VISIT OUR WEBSITE FOR ADDITIONAL INFORMATION: [email or web address]

## 2023-04-21 NOTE — Progress Notes (Signed)
Physical Therapy Treatment Patient Details Name: Christine Arias MRN: [id number] DOB: [birth year] Today's Date: 04/21/2023   History of Present Illness 67 yo female admitted 6/3 after sustaining presyncopal event with a fall to R hip and elbow.  Received R olecranon fracture with elbow dislocation, ORIF olecranon; minimally displaced R sacral ala fracture, R superior pubic ramus and R inferior ischiopubic ramus fractures with R SI screw fixation.  PMHx: anxiety, depression, GERD, tachycardia    PT Comments    Pt is demonstrating better awareness of how to set up to get up from bed, to stand and to walk.  Continues to work on sequence for getting onto walker and off, mainly needing prompts to manage RUE in the correct timing.  Pt is very motivated to get home, with her family in attendance today to see her walk.  Follow up with her for practice and balance training, to continue to refine the automatic use of platform and to work toward safest use of walker.  Follow goals of PT on POC.    Recommendations for follow up therapy are one component of a multi-disciplinary discharge planning process, led by the attending physician.  Recommendations may be updated based on patient status, additional functional criteria and insurance authorization.  Follow Up Recommendations  Can patient physically be transported by private vehicle: No    Assistance Recommended at Discharge Frequent or constant Supervision/Assistance  Patient can return home with the following A little help with walking and/or transfers;A little help with bathing/dressing/bathroom;Assistance with cooking/housework;Assist for transportation;Help with stairs or ramp for entrance   Equipment Recommendations  BSC/3in1;Other (comment) (AD is to be determined)    Recommendations for Other Services       Precautions / Restrictions Precautions Precautions: Fall Precaution Comments: No lifting/push/pull with R arm. Ok to [employer] using  platform Required Braces or Orthoses: Sling;Splint/Cast Splint/Cast: R elbow splint and sling Restrictions RUE Weight Bearing: Weight bearing as tolerated RLE Weight Bearing: Weight bearing as tolerated     Mobility  Bed Mobility Overal bed mobility: Needs Assistance Bed Mobility: Supine to Sit Rolling: Min assist   Supine to sit: Min assist          Transfers Overall transfer level: Needs assistance Equipment used: Right platform walker Transfers: Sit to/from Stand Sit to Stand: Min assist           General transfer comment: requires some reminding about hand placement every trial and the sequence to manage RUE with walker    Ambulation/Gait Ambulation/Gait assistance: Min guard, Min assist Gait Distance (Feet): 45 Feet Assistive device: Rolling walker (2 wheels) Gait Pattern/deviations: Step-through pattern, Decreased stride length, Wide base of support Gait velocity: reduced   Pre-gait activities: balance ck and discussion of sequence General Gait Details: better safety with uneven surfaces and controlling walker on turns and set ups, reminders for sequence to stand and sit   Stairs             Wheelchair Mobility    Modified Rankin (Stroke Patients Only)       Balance Overall balance assessment: Needs assistance Sitting-balance support: Feet supported Sitting balance-Leahy Scale: Good     Standing balance support: Bilateral upper extremity supported, During functional activity Standing balance-Leahy Scale: Fair Standing balance comment: fair static but less with dynamic balance                            Cognition Arousal/Alertness: Awake/alert Behavior During Therapy: [facility]  for tasks assessed/performed Overall Cognitive Status: Within Functional Limits for tasks assessed                                 General Comments: not out of bed yet today        Exercises      General Comments General comments (skin  integrity, edema, etc.): Pt is up to walk on platform walker to BR and managed to dry herself, walking in room with min guard for the most part, more independent.      Pertinent Vitals/Pain Pain Assessment Pain Assessment: Faces Faces Pain Scale: Hurts little more Pain Location: R LE Pain Descriptors / Indicators: Sore, Guarding Pain Intervention(s): Limited activity within patient's tolerance, Monitored during session, Premedicated before session, Repositioned    Home Living                          Prior Function            PT Goals (current goals can now be found in the care plan section) Acute Rehab PT Goals Patient Stated Goal: to completely recover mobility and independence Progress towards PT goals: Progressing toward goals    Frequency    Min 5X/week      PT Plan Discharge plan needs to be updated    Co-evaluation              AM-PAC PT "6 Clicks" Mobility   Outcome Measure  Help needed turning from your back to your side while in a flat bed without using bedrails?: A Little Help needed moving from lying on your back to sitting on the side of a flat bed without using bedrails?: A Little Help needed moving to and from a bed to a chair (including a wheelchair)?: A Little Help needed standing up from a chair using your arms (e.g., wheelchair or bedside chair)?: A Little Help needed to walk in hospital room?: A Lot Help needed climbing 3-5 steps with a railing? : Total 6 Click Score: 15    End of Session Equipment Utilized During Treatment: Gait belt Activity Tolerance: Patient tolerated treatment well Patient left: in chair;with call bell/phone within reach;with chair alarm set;with family/visitor present Nurse Communication: Mobility status;Precautions PT Visit Diagnosis: Unsteadiness on feet (R26.81);Muscle weakness (generalized) (M62.81);Pain Pain - Right/Left: Right Pain - part of body: Arm (back)     Time: 5621-3086 PT Time  Calculation (min) (ACUTE ONLY): 25 min  Charges:  $Gait Training: 8-22 mins $Therapeutic Activity: 8-22 mins           Ivar Drape 04/21/2023, 4:58 PM  Samul Dada, PT PhD Acute Rehab Dept. Number: [facility] [id number] and [facility] [phone]

## 2023-04-21 NOTE — NC FL2 (Addendum)
[place] MEDICAID FL2 LEVEL OF CARE FORM     IDENTIFICATION  Patient Name: Christine Arias Birthdate: 05-15-1956 Sex: female Admission Date (Current Location): 04/17/2023  [facility] and [place] Number:  Producer, television/film/video and Address:  [facility], [address] [place], [place] [address]      Provider Number: [id number]  Attending Physician Name and Address:  Lonia Blood, MD  Relative Name and Phone Number:       Current Level of Care: Hospital Recommended Level of Care: Skilled Nursing Facility Prior Approval Number:    Date Approved/Denied:   PASRR Number: [id number]  Discharge Plan: SNF    Current Diagnoses: Patient Active Problem List   Diagnosis Date Noted   OSA on CPAP 04/17/2023   Closed fracture dislocation of right elbow 04/17/2023   Near syncope 04/17/2023   Closed fracture dislocation of right elbow joint, initial encounter 04/16/2023   Closed fracture of right pelvis, initial encounter (HCC) 04/16/2023   Lightheadedness/presyncope 04/16/2023   History of melanoma 12/19/2014   History of diverticulitis 12/19/2014   Family history of colonic polyps 12/19/2014   Breast mass 12/10/2013   Fibrocystic breast disease 12/10/2013   History of diverticulitis of colon    Melanoma (HCC)     Orientation RESPIRATION BLADDER Height & Weight     Self, Time, Place, Situation  Normal Continent Weight: 170 lb (77.1 kg) Height:  5\' 3"  (160 cm)  BEHAVIORAL SYMPTOMS/MOOD NEUROLOGICAL BOWEL NUTRITION STATUS      Continent Diet  AMBULATORY STATUS COMMUNICATION OF NEEDS Skin   Extensive Assist Verbally Normal (S/P ORIF ELBOW/OLECRANON FRACTURE (Right: Elbow)  SACRO-ILIAC PINNING RIGHT (Right), 6/5)                       Personal Care Assistance Level of Assistance  Bathing, Feeding, Dressing Bathing Assistance: Maximum assistance Feeding assistance: Limited assistance Dressing Assistance: Maximum assistance     Functional  Limitations Info  Sight, Hearing, Speech Sight Info: Adequate Hearing Info: Adequate Speech Info: Adequate    SPECIAL CARE FACTORS FREQUENCY  PT (By licensed PT), OT (By licensed OT)     PT Frequency: 5x/ week, evaluate and treat OT Frequency: 5x/ week, evaluate and treat            Contractures Contractures Info: Not present    Additional Factors Info  Code Status, Allergies Code Status Info: Full Code Allergies Info: No Known Allergies           Current Medications (04/21/2023):  This is the current hospital active medication list Current Facility-Administered Medications  Medication Dose Route Frequency Provider Last Rate Last Admin   acetaminophen (TYLENOL) tablet 650 mg  650 mg Oral Q8H Montez Morita, PA-C   650 mg at 04/21/23 0459   bisacodyl (DULCOLAX) EC tablet 5 mg  5 mg Oral Daily PRN Montez Morita, PA-C   5 mg at 04/19/23 1258   busPIRone (BUSPAR) tablet 10 mg  10 mg Oral TID Montez Morita, PA-C   10 mg at 04/21/23 5621   Chlorhexidine Gluconate Cloth 2 % PADS 6 each  6 each Topical Daily Lonia Blood, MD   6 each at 04/21/23 0951   enoxaparin (LOVENOX) injection 40 mg  40 mg Subcutaneous Q24H Montez Morita, PA-C   40 mg at 04/20/23 2041   escitalopram (LEXAPRO) tablet 10 mg  10 mg Oral Daily Montez Morita, PA-C   10 mg at 04/21/23 0950   HYDROmorphone (  DILAUDID) injection 0.5 mg  0.5 mg Intravenous Q4H PRN Montez Morita, PA-C   0.5 mg at 04/20/23 2041   lip balm (CARMEX) ointment   Topical PRN Jerald Kief, MD       methocarbamol (ROBAXIN) tablet 500 mg  500 mg Oral Q8H PRN Montez Morita, PA-C   500 mg at 04/21/23 0500   metoCLOPramide (REGLAN) tablet 10 mg  10 mg Oral TID PRN Montez Morita, PA-C       mupirocin ointment (BACTROBAN) 2 % 1 Application  1 Application Nasal BID Lonia Blood, MD   1 Application at 04/21/23 [phone]   oxyCODONE (Oxy IR/ROXICODONE) immediate release tablet 5-10 mg  5-10 mg Oral Q4H PRN Montez Morita, PA-C   10 mg at 04/21/23 0459    pantoprazole (PROTONIX) EC tablet 40 mg  40 mg Oral BID Montez Morita, PA-C   40 mg at 04/21/23 0950   rOPINIRole (REQUIP) tablet 3 mg  3 mg Oral QHS Montez Morita, PA-C   3 mg at 04/20/23 2040   rosuvastatin (CRESTOR) tablet 10 mg  10 mg Oral Daily Montez Morita, PA-C   10 mg at 04/21/23 9604   senna-docusate (Senokot-S) tablet 1 tablet  1 tablet Oral QHS PRN Montez Morita, PA-C       sucralfate (CARAFATE) tablet 1 g  1 g Oral BID Montez Morita, PA-C   1 g at 04/21/23 5409     Discharge Medications: Please see discharge summary for a list of discharge medications.  Relevant Imaging Results:  Relevant Lab Results:   Additional Information ss# [id number]  Lorri Frederick, LCSW

## 2023-04-21 NOTE — Plan of Care (Signed)

## 2023-04-22 ENCOUNTER — Other Ambulatory Visit: Payer: Self-pay

## 2023-04-22 DIAGNOSIS — S42401D Unspecified fracture of lower end of right humerus, subsequent encounter for fracture with routine healing: Secondary | ICD-10-CM | POA: Diagnosis not present

## 2023-04-22 MED ORDER — TOLNAFTATE 1 % EX POWD
Freq: Two times a day (BID) | CUTANEOUS | Status: DC
  Filled 2023-04-22: qty 45

## 2023-04-22 MED ORDER — POLYETHYLENE GLYCOL 3350 17 G PO PACK
17.0000 g | PACK | Freq: Every day | ORAL | Status: DC
  Administered 2023-04-22 – 2023-04-24 (×3): 17 g via ORAL
  Filled 2023-04-22 (×3): qty 1

## 2023-04-22 MED ORDER — CLOTRIMAZOLE 1 % EX CREA
TOPICAL_CREAM | Freq: Two times a day (BID) | CUTANEOUS | Status: DC
  Filled 2023-04-22 (×2): qty 15

## 2023-04-22 MED ORDER — LORATADINE 10 MG PO TABS
10.0000 mg | ORAL_TABLET | Freq: Every day | ORAL | Status: DC
  Administered 2023-04-22 – 2023-04-24 (×3): 10 mg via ORAL
  Filled 2023-04-22 (×3): qty 1

## 2023-04-22 NOTE — Progress Notes (Addendum)
Occupational Therapy Treatment Patient Details Name: Christine Arias MRN: [id number] DOB: [birth year] Today's Date: 04/22/2023   History of present illness 67 yo female admitted 6/3 after sustaining presyncopal event with a fall to R hip and elbow.  Received R olecranon fracture with elbow dislocation, ORIF olecranon; minimally displaced R sacral ala fracture, R superior pubic ramus and R inferior ischiopubic ramus fractures with R SI screw fixation.  PMHx: anxiety, depression, GERD, tachycardia   OT comments  Pt. Seen for skilled OT treatment session.  Good management and sequencing with platform walker.  Able to amb. To/from b.room for toileting task min guard a.  Standing grooming task with S.  Limited with LB ADLS and managing after BM as he is R hand dominant.  Plan for introduction of use of a/e and additional strategies/modifications for these ADLS next session.     Recommendations for follow up therapy are one component of a multi-disciplinary discharge planning process, led by the attending physician.  Recommendations may be updated based on patient status, additional functional criteria and insurance authorization.    Assistance Recommended at Discharge Intermittent Supervision/Assistance  Patient can return home with the following  A lot of help with bathing/dressing/bathroom;Assistance with cooking/housework;Assistance with feeding;Assist for transportation   Equipment Recommendations  None recommended by OT    Recommendations for Other Services Rehab consult    Precautions / Restrictions Precautions Precautions: Fall Precaution Comments: No lifting/push/pull with R arm. Ok to [employer] using platform Required Braces or Orthoses: Sling;Splint/Cast Splint/Cast: R elbow splint and sling Restrictions RUE Weight Bearing: Weight bearing as tolerated RLE Weight Bearing: Weight bearing as tolerated Other Position/Activity Restrictions: WBAT on LE's       Mobility Bed Mobility                     Transfers                         Balance                                           ADL either performed or assessed with clinical judgement   ADL Overall ADL's : Needs assistance/impaired     Grooming: Wash/dry hands;Standing;Supervision/safety               Lower Body Dressing: Sitting/lateral leans;Maximal assistance Lower Body Dressing Details (indicate cue type and reason): Pt able to doff/don L sock with supervision, needs Max A for R sock Toilet Transfer: Min guard;Comfort height toilet   Toileting- Clothing Manipulation and Hygiene: Min guard;Maximal assistance Toileting - Clothing Manipulation Details (indicate cue type and reason): able to manage after urinating but states unable to care for herself after BM as she is R handed but says "im gonna keep trying well get there" reviewed compensatory tech. with her and flushable wet wiping options that can help make it easier also     Functional mobility during ADLs: Min guard;Rolling walker (2 wheels)      Extremity/Trunk Assessment              Vision       Perception     Praxis      Cognition Arousal/Alertness: Awake/alert Behavior During Therapy: [facility] for tasks assessed/performed  Exercises      Shoulder Instructions       General Comments      Pertinent Vitals/ Pain       Pain Assessment Pain Assessment: Faces Faces Pain Scale: Hurts little more Pain Location: R UE Pain Descriptors / Indicators: Sore, Guarding Pain Intervention(s): Premedicated before session, Repositioned, Monitored during session, Limited activity within patient's tolerance  Home Living                                          Prior Functioning/Environment              Frequency  Min 3X/week        Progress Toward Goals  OT Goals(current goals can now be found in the care plan  section)  Progress towards OT goals: Progressing toward goals     Plan Frequency remains appropriate;Discharge plan needs to be updated    Co-evaluation                 AM-PAC OT "6 Clicks" Daily Activity     Outcome Measure   Help from another person eating meals?: A Little Help from another person taking care of personal grooming?: A Little Help from another person toileting, which includes using toliet, bedpan, or urinal?: A Little Help from another person bathing (including washing, rinsing, drying)?: A Lot Help from another person to put on and taking off regular upper body clothing?: A Little Help from another person to put on and taking off regular lower body clothing?: A Lot 6 Click Score: 16    End of Session Equipment Utilized During Treatment: Gait belt;Rolling walker (2 wheels)  OT Visit Diagnosis: Pain;Muscle weakness (generalized) (M62.81) Pain - Right/Left: Right Pain - part of body: Arm   Activity Tolerance Patient tolerated treatment well   Patient Left in chair;with call bell/phone within reach   Nurse Communication          Time: 1130-1147 OT Time Calculation (min): 17 min  Charges: OT General Charges $OT Visit: 1 Visit OT Treatments $Self Care/Home Management : 8-22 mins  Christine Arias, COTA/L Acute Rehabilitation [phone]   Christine Arias-COTA/L 04/22/2023, 12:39 PM

## 2023-04-22 NOTE — Progress Notes (Signed)
Christine Arias  ZOX:[id number] DOB: 04/01/1956 DOA: 04/17/2023 PCP: System, Provider Not In    Brief Narrative:  67 year old with a history of anxiety/depression and GERD who suffered a fall with resultant elbow and pelvic pain.  CT of the right elbow revealed a distal humerus fracture with anterior radial head dislocation/subluxation.  CT of the pelvis revealed right pubic rami fractures and a minimally displaced right sacral alla.  Consultants:  Orthopedic Surgery  Goals of Care:  Code Status: Full Code   DVT prophylaxis: Lovenox  Interim Hx: No acute issues recorded overnight.  Afebrile.  Vital signs stable.  Because of the patient's pelvic fracture she is very slow at getting up and is having difficulty with urge incontinence happening faster than she can moved to get into the bathroom.  As result I have okayed the use of a pure wick for her.  No new complaints at time of exam.  Resting poorly at night.  Assessment & Plan:  Right elbow/distal humerus fracture Underwent ORIF per Orthopedic Surgery 04/18/2023 - postoperative care per Orthopedics -appears she will need a SNF rehab stay -medically ready for disposition as soon as bed is identified  Pelvic fracture Orthopedics felt that repeat imaging actually revealed a fracture all the way through the ala in addition to the more obvious anterior displaced buckle fragment - now s/p sacroiliac screw fixation 04/18/23 - postoperative care per Orthopedics  Near syncope This preceded the patient's fall - she had just completed a [place] - PE and DVT ruled out - no findings on telemetry monitoring - TTE unrevealing - has a remote history of paroxysmal tachycardia therefore if symptoms recur could consider event monitor -no further inpatient workup indicated at this time  Chronic OSA Has chosen not to utilize CPAP during her inpatient stay  Family Communication: No family present at time of exam Disposition:  Medically stable for discharge - will need SNF rehab stay -awaiting bed   Objective: Blood pressure (!) 142/67, pulse 66, temperature 98.1 F (36.7 C), temperature source Oral, resp. rate 15, height 5\' 3"  (1.6 m), weight 77.1 kg, SpO2 94 %.  Intake/Output Summary (Last 24 hours) at 04/22/2023 0946 Last data filed at 04/22/2023 0510 Gross per 24 hour  Intake --  Output 850 ml  Net -850 ml    Filed Weights   04/17/23 1700 04/18/23 0947  Weight: 81.6 kg 77.1 kg    Examination: General: No acute respiratory distress Lungs: Clear to auscultation bilaterally without wheezes or crackles Cardiovascular: RRR w/o M or R Abdomen: NT/ND, soft, bs+, no mass  Extremities: No significant edema B lower extremities  CBC: Recent Labs  Lab 04/18/23 0215 04/19/23 0217 04/21/23 0810  WBC 9.3 9.9 7.3  HGB 11.4* 10.8* 10.6*  HCT 33.3* 32.7* 31.2*  MCV 94.1 96.2 95.4  PLT 154 165 206    Basic Metabolic Panel: Recent Labs  Lab 04/16/23 1655 04/21/23 0810  NA 140 140  K 3.5 3.5  CL 105 106  CO2 27 26  GLUCOSE 116* 99  BUN 20 8  CREATININE 0.73 0.64  CALCIUM 9.2 9.0  MG  --  2.0  PHOS  --  3.2    GFR: Estimated Creatinine Clearance: 65.3 mL/min (by C-G formula based on SCr of 0.64 mg/dL).   Scheduled Meds:  acetaminophen  650 mg Oral Q8H   apixaban  2.5 mg Oral BID   busPIRone  10 mg Oral TID   Chlorhexidine Gluconate Cloth  6 each Topical Daily   cholecalciferol  2,000 Units Oral BID   escitalopram  10 mg Oral Daily   loratadine  10 mg Oral Daily   mupirocin ointment  1 Application Nasal BID   pantoprazole  40 mg Oral BID   rOPINIRole  3 mg Oral QHS   rosuvastatin  10 mg Oral Daily   sucralfate  1 g Oral BID     LOS: 5 days   Lonia Blood, MD [facility] Office  [phone] Pager - Text Page per Loretha Stapler  If 7PM-7AM, please contact night-coverage per Amion 04/22/2023, 9:46 AM

## 2023-04-23 DIAGNOSIS — S42401D Unspecified fracture of lower end of right humerus, subsequent encounter for fracture with routine healing: Secondary | ICD-10-CM | POA: Diagnosis not present

## 2023-04-23 NOTE — Plan of Care (Signed)
  Problem: Safety: Goal: Ability to remain free from injury will improve Outcome: Not Progressing   

## 2023-04-23 NOTE — Progress Notes (Signed)
Christine Arias  UJW:[id number] DOB: 1956-07-04 DOA: 04/17/2023 PCP: System, Provider Not In    Brief Narrative:  67 year old with a history of anxiety/depression and GERD who suffered a fall with resultant elbow and pelvic pain.  CT of the right elbow revealed a distal humerus fracture with anterior radial head dislocation/subluxation.  CT of the pelvis revealed right pubic rami fractures and a minimally displaced right sacral alla.  Consultants:  Orthopedic Surgery  Goals of Care:  Code Status: Full Code   DVT prophylaxis: Lovenox  Interim Hx: No acute issues recorded overnight.  Afebrile.  Vital signs stable.  Assessment & Plan:  Right elbow/distal humerus fracture Underwent ORIF per Orthopedic Surgery 04/18/2023 - postoperative care per Orthopedics -appears she will need a SNF rehab stay -medically ready for disposition as soon as bed is identified  Pelvic fracture Orthopedics felt that repeat imaging actually revealed a fracture all the way through the ala in addition to the more obvious anterior displaced buckle fragment - now s/p sacroiliac screw fixation 04/18/23 - postoperative care per Orthopedics  Near syncope This preceded the patient's fall - she had just completed a 3-hour trip [place] from [place] - PE and DVT ruled out - no findings on telemetry monitoring - TTE unrevealing - has a remote history of paroxysmal tachycardia therefore if symptoms recur could consider event monitor -no further inpatient workup indicated at this time  Chronic OSA Has chosen not to utilize CPAP during her inpatient stay  Family Communication: No family present at time of exam Disposition: Medically stable for discharge - will need SNF rehab stay -awaiting bed   Objective: Blood pressure 136/82, pulse 70, temperature 98.3 F (36.8 C), temperature source Oral, resp. rate 18, height 5\' 3"  (1.6 m), weight 77.1 kg, SpO2 99 %.  Intake/Output Summary (Last 24 hours) at 04/23/2023 1007 Last  data filed at 04/23/2023 0529 Gross per 24 hour  Intake 480 ml  Output 800 ml  Net -320 ml    Filed Weights   04/17/23 1700 04/18/23 0947  Weight: 81.6 kg 77.1 kg    Examination: General: No acute respiratory distress Lungs: Clear to auscultation bilaterally  Cardiovascular: RRR w/o M or R   CBC: Recent Labs  Lab 04/18/23 0215 04/19/23 0217 04/21/23 0810  WBC 9.3 9.9 7.3  HGB 11.4* 10.8* 10.6*  HCT 33.3* 32.7* 31.2*  MCV 94.1 96.2 95.4  PLT 154 165 206    Basic Metabolic Panel: Recent Labs  Lab 04/16/23 1655 04/21/23 0810  NA 140 140  K 3.5 3.5  CL 105 106  CO2 27 26  GLUCOSE 116* 99  BUN 20 8  CREATININE 0.73 0.64  CALCIUM 9.2 9.0  MG  --  2.0  PHOS  --  3.2    GFR: Estimated Creatinine Clearance: 65.3 mL/min (by C-G formula based on SCr of 0.64 mg/dL).   Scheduled Meds:  acetaminophen  650 mg Oral Q8H   apixaban  2.5 mg Oral BID   busPIRone  10 mg Oral TID   Chlorhexidine Gluconate Cloth  6 each Topical Daily   cholecalciferol  2,000 Units Oral BID   clotrimazole   Topical BID   escitalopram  10 mg Oral Daily   loratadine  10 mg Oral Daily   mupirocin ointment  1 Application Nasal BID   pantoprazole  40 mg Oral BID   polyethylene glycol  17 g Oral Daily   rOPINIRole  3 mg Oral QHS   rosuvastatin  10  mg Oral Daily   sucralfate  1 g Oral BID     LOS: 6 days   Lonia Blood, MD [facility] Office  [phone] Pager - Text Page per Amion  If 7PM-7AM, please contact night-coverage per Amion 04/23/2023, 10:07 AM

## 2023-04-23 NOTE — Progress Notes (Signed)
Occupational Therapy Treatment Patient Details Name: Christine Arias MRN: [id number] DOB: [birth year] Today's Date: 04/23/2023   History of present illness 67 yo female admitted 6/3 after sustaining presyncopal event with a fall to R hip and elbow.  Received R olecranon fracture with elbow dislocation, ORIF olecranon; minimally displaced R sacral ala fracture, R superior pubic ramus and R inferior ischiopubic ramus fractures with R SI screw fixation.  PMHx: anxiety, depression, GERD, tachycardia   OT comments  Patient up in recliner upon entry and able to stand from recliner with min assist and ambulate to sink for grooming tasks with min guard assist. Patient instructed on one handed technique for donning socks with mod assist from max assist for donning right socks. Patient demonstrating gains with OT treatment. Patient will benefit from continued inpatient follow up therapy, <3 hours/day to address bathing, dressing, and functional transfers. Acute OT to continue to follow.     Recommendations for follow up therapy are one component of a multi-disciplinary discharge planning process, led by the attending physician.  Recommendations may be updated based on patient status, additional functional criteria and insurance authorization.    Assistance Recommended at Discharge Intermittent Supervision/Assistance  Patient can return home with the following  A lot of help with bathing/dressing/bathroom;Assistance with cooking/housework;Assistance with feeding;Assist for transportation   Equipment Recommendations  None recommended by OT    Recommendations for Other Services      Precautions / Restrictions Precautions Precautions: Fall Precaution Comments: No lifting/push/pull with R arm. Ok to [employer] using platform Required Braces or Orthoses: Sling;Splint/Cast Splint/Cast: R elbow splint and sling Restrictions RUE Weight Bearing: Weight bearing as tolerated RLE Weight Bearing: Weight bearing as  tolerated Other Position/Activity Restrictions: WBAT on LE's       Mobility Bed Mobility Overal bed mobility: Needs Assistance             General bed mobility comments: OOB in recliner    Transfers Overall transfer level: Needs assistance Equipment used: Right platform walker Transfers: Sit to/from Stand Sit to Stand: Min assist           General transfer comment: min assist to stand and min guard when up for safety with cues for hand placement     Balance Overall balance assessment: Needs assistance Sitting-balance support: Feet supported Sitting balance-Leahy Scale: Good     Standing balance support: Single extremity supported, Bilateral upper extremity supported, No upper extremity supported Standing balance-Leahy Scale: Fair Standing balance comment: able to stand at sink for grooming with one extremity support                           ADL either performed or assessed with clinical judgement   ADL Overall ADL's : Needs assistance/impaired     Grooming: Wash/dry hands;Wash/dry face;Oral care;Min guard;Standing Grooming Details (indicate cue type and reason): standing at sink with platform walker and one extremity support             Lower Body Dressing: Moderate assistance;Sitting/lateral leans Lower Body Dressing Details (indicate cue type and reason): able to doff socks and donn left sock and mod assist for right sock with assistance to initiate Toilet Transfer: Min guard (platform walker) Toilet Transfer Details (indicate cue type and reason): simulated                Extremity/Trunk Assessment              Vision  Perception     Praxis      Cognition Arousal/Alertness: Awake/alert Behavior During Therapy: WFL for tasks assessed/performed Overall Cognitive Status: Within Functional Limits for tasks assessed                                 General Comments: following commands, aware of  precautions        Exercises      Shoulder Instructions       General Comments      Pertinent Vitals/ Pain       Pain Assessment Pain Assessment: Faces Faces Pain Scale: Hurts little more Pain Location: R UE Pain Descriptors / Indicators: Sore, Guarding Pain Intervention(s): Limited activity within patient's tolerance, Monitored during session, Repositioned  Home Living                                          Prior Functioning/Environment              Frequency  Min 3X/week        Progress Toward Goals  OT Goals(current goals can now be found in the care plan section)  Progress towards OT goals: Progressing toward goals  Acute Rehab OT Goals Patient Stated Goal: go back to [facility] OT Goal Formulation: With patient Time For Goal Achievement: 05/03/23 ADL Goals Pt Will Perform Grooming: standing;with supervision Pt Will Perform Lower Body Bathing: sitting/lateral leans;with set-up;with supervision Pt Will Perform Upper Body Dressing: with set-up;with supervision;sitting Pt Will Perform Lower Body Dressing: sitting/lateral leans;with set-up;with supervision Pt Will Transfer to Toilet: with supervision;ambulating Additional ADL Goal #1: Pt will independently complete HEP for AROM of uninvolved joints  Plan Frequency remains appropriate;Discharge plan needs to be updated    Co-evaluation                 AM-PAC OT "6 Clicks" Daily Activity     Outcome Measure   Help from another person eating meals?: A Little Help from another person taking care of personal grooming?: A Little Help from another person toileting, which includes using toliet, bedpan, or urinal?: A Little Help from another person bathing (including washing, rinsing, drying)?: A Lot Help from another person to put on and taking off regular upper body clothing?: A Little Help from another person to put on and taking off regular lower body clothing?: A Lot 6 Click  Score: 16    End of Session Equipment Utilized During Treatment: Gait belt;Rolling walker (2 wheels) (platform)  OT Visit Diagnosis: Pain;Muscle weakness (generalized) (M62.81) Pain - Right/Left: Right Pain - part of body: Arm   Activity Tolerance Patient tolerated treatment well   Patient Left in chair;with call bell/phone within reach   Nurse Communication Mobility status        Time: 4540-9811 OT Time Calculation (min): 25 min  Charges: OT General Charges $OT Visit: 1 Visit OT Treatments $Self Care/Home Management : 23-37 mins  Alfonse Flavors, OTA Acute Rehabilitation Services  Office [phone]   Dewain Penning 04/23/2023, 12:17 PM

## 2023-04-23 NOTE — Plan of Care (Signed)

## 2023-04-24 DIAGNOSIS — S42401D Unspecified fracture of lower end of right humerus, subsequent encounter for fracture with routine healing: Secondary | ICD-10-CM | POA: Diagnosis not present

## 2023-04-24 MED ORDER — POLYETHYLENE GLYCOL 3350 17 G PO PACK: 17.0000 g | PACK | Freq: Every day | ORAL | 0 refills | Status: AC

## 2023-04-24 MED ORDER — SENNOSIDES-DOCUSATE SODIUM 8.6-50 MG PO TABS: 1.0000 | ORAL_TABLET | Freq: Every evening | ORAL | Status: AC | PRN

## 2023-04-24 NOTE — Progress Notes (Signed)
Orthopaedic Trauma Service Progress Note  Patient ID: DALLYS NOWAKOWSKI MRN: [id number] DOB/AGE: 69-Nov-1957 69 y.o.  Subjective:  Continues to improve  No other complaints    Awaiting SNF offer    ROS As above  Objective:   VITALS:   Vitals:   04/23/23 1425 04/23/23 1951 04/24/23 0445 04/24/23 0814  BP: (!) 133/59 (!) 147/71 (!) 150/73 (!) 141/80  Pulse: 88 76 68 79  Resp: 18 18 15 16   Temp: 98.2 F (36.8 C) 98.8 F (37.1 C) 98.5 F (36.9 C) 98.4 F (36.9 C)  TempSrc: Oral   Oral  SpO2: 98% 98% 97% 99%  Weight:      Height:        Estimated body mass index is 30.11 kg/m as calculated from the following:   Height as of this encounter: 5\' 3"  (1.6 m).   Weight as of this encounter: 77.1 kg.   Intake/Output      06/09 0701 06/10 0700 06/10 0701 06/11 0700   P.O.     Total Intake(mL/kg)     Urine (mL/kg/hr) 600 (0.3)    Stool     Total Output 600    Net -600           LABS  No results found for this or any previous visit (from the past 24 hour(s)).   PHYSICAL EXAM:   Gen: awake, alert, very pleasant, sitting up in chair, reading book  Lungs: unlabored Cardiac: reg Ext:       Right Upper Extremtiy              Splint clean and intact             Ext warm              Minimal swelling              Sling in place             Brisk cap refill             Motor and sensory functions intact                   Right Lower Extremity              Dressing R flank clean              EHL and ankle extension intact             DPN sensation intact               Assessment/Plan: 6 Days Post-Op    Anti-infectives (From admission, onward)    Start     Dose/Rate Route Frequency Ordered Stop   04/18/23 1730  ceFAZolin (ANCEF) IVPB 2g/100 mL premix        2 g 200 mL/hr over 30 Minutes Intravenous Every 8 hours 04/18/23 1632 04/19/23 0546   04/18/23 1000  ceFAZolin (ANCEF) IVPB  2g/100 mL premix  Status:  Discontinued        2 g 200 mL/hr over 30 Minutes Intravenous On call to O.R. 04/18/23 0947 04/18/23 1628   04/18/23 0949  ceFAZolin (ANCEF) 2-4 GM/100ML-% IVPB       Note to Pharmacy: Shanda Bumps M: cabinet override      04/18/23 [phone]  04/18/23 2159     .  POD/HD#:   Fall with R olecranon fracture/elbow dislocation, R pelvic ring fracture   -R olecranon fracture/elbow dislocation s/p ORIF R olecranon  Weightbearing No lifting with R arm  Ok to [employer] using platform                ROM/Activity                         No elbow motion yet as she is splinted                         Will start gentle motion in about 1 week                Wound care                         dc splint prior to dc    - R pelvic ring fracture s/p SI screw              WBAT R leg with walker              No motion restrictions              Therapy evals    - Pain management:             Multimodal    - ABL anemia/Hemodynamics             Stable   - Medical issues              Per primary    - DVT/PE prophylaxis:             eliquis x 30 days    - ID:              Periop abx completed    - Metabolic Bone Disease:             Vitamin d insufficiency                          Supplement    - Activity:             As above     - Dispo:             Therapies             Ortho issues stable             Follow up with ortho in 10 days             Stable for dc to snf   [name], PA-C [phone] (C) 04/24/2023, 11:23 AM  [facility] [address] [place] [address] [phone] Val [phone] (F)    After 5pm and on the weekends please log on to Amion, go to orthopaedics and the look under the [facility] Call for the provider(s) on call. You can also call our office at [phone] and then follow the prompts to be connected to the call team.  Patient ID: Jodean Lima, female   DOB: 1955/11/24, 67 y.o.   MRN:  [id number]

## 2023-04-24 NOTE — Plan of Care (Signed)
AVS reviewed and placed in DC packet.  All questions answered.  Report called to [facility].  Patient waiting [facility] transport.

## 2023-04-24 NOTE — TOC Transition Note (Signed)
Transition of Care [facility]) - CM/SW Discharge Note   Patient Details  Name: Christine Arias MRN: [id number] Date of Birth: 1956-09-06  [facility]) CM/SW Contact:  Lorri Frederick, LCSW Phone Number: 04/24/2023, 1:15 PM   Clinical Narrative:   Pt discharging to [facility].  RN call report to [phone].     Final next level of care: Skilled Nursing Facility Barriers to Discharge: Barriers Resolved   Patient Goals and CMS Choice   Choice offered to / list presented to : Patient  Discharge Placement                Patient chooses bed at:  [facility]) Patient to be transferred to facility by: [facility] Name of family member notified: husband Tommy Patient and family notified of of transfer: 04/24/23  Discharge Plan and Services Additional resources added to the After Visit Summary for                                       Social Determinants of Health (SDOH) Interventions SDOH Screenings   Food Insecurity: No Food Insecurity (04/17/2023)  Housing: Low Risk  (04/17/2023)  Transportation Needs: No Transportation Needs (04/17/2023)  Utilities: Not At Risk (04/17/2023)  Tobacco Use: Low Risk  (04/18/2023)     Readmission Risk Interventions     No data to display

## 2023-04-24 NOTE — TOC Progression Note (Addendum)
Transition of Care [facility]) - Progression Note    Patient Details  Name: Christine Arias MRN: [id number] Date of Birth: 01-11-1956  Transition of Care [facility]) CM/SW Contact  Lorri Frederick, LCSW Phone Number: 04/24/2023, 9:52 AM  Clinical Narrative:   CSW provided bed offers to pt, she is requesting a response from [facility].  CSW reached out to that facility.   1100: [facility] responded, they are full. Pt updated with this and with other [facility] bed offers.  1145: Pt accepts offer at [facility]. CSW confirmed with [facility] that she can receive pt today, needs DC summary by 2pm.  MD informed.  1255: DC summary sent.   Medicare payer, inpt order 04/17/23.    Expected Discharge Plan: Skilled Nursing Facility Barriers to Discharge: Continued Medical Work up  Expected Discharge Plan and Services       Living arrangements for the past 2 months: Single Family Home                                       Social Determinants of Health (SDOH) Interventions SDOH Screenings   Food Insecurity: No Food Insecurity (04/17/2023)  Housing: Low Risk  (04/17/2023)  Transportation Needs: No Transportation Needs (04/17/2023)  Utilities: Not At Risk (04/17/2023)  Tobacco Use: Low Risk  (04/18/2023)    Readmission Risk Interventions     No data to display

## 2023-04-24 NOTE — Progress Notes (Signed)
Physical Therapy Treatment Patient Details Name: Christine Arias MRN: [id number] DOB: 1956/11/02 Today's Date: 04/24/2023   History of Present Illness 67 yo female admitted 6/3 after sustaining presyncopal event with a fall to R hip and elbow.  Received R olecranon fracture with elbow dislocation, ORIF olecranon; minimally displaced R sacral ala fracture, R superior pubic ramus and R inferior ischiopubic ramus fractures with R SI screw fixation.  PMHx: anxiety, depression, GERD, tachycardia    PT Comments    Pt was seen for final PT visit just prior to discharge to SNF for final care and development of safety and independence.  Her assist at home is limited by a family trip but demonstrates ability to return there soon with safer balance and better control of standing today.  Follow acutely as stay permits, but discharge is pending to SNF care.   Recommendations for follow up therapy are one component of a multi-disciplinary discharge planning process, led by the attending physician.  Recommendations may be updated based on patient status, additional functional criteria and insurance authorization.  Follow Up Recommendations  Can patient physically be transported by private vehicle: No    Assistance Recommended at Discharge Frequent or constant Supervision/Assistance  Patient can return home with the following A little help with walking and/or transfers;A little help with bathing/dressing/bathroom;Assistance with cooking/housework;Assist for transportation;Help with stairs or ramp for entrance   Equipment Recommendations  BSC/3in1;Other (comment)    Recommendations for Other Services       Precautions / Restrictions Precautions Precautions: Fall Precaution Comments: No lifting/push/pull with R arm. Ok to [employer] using platform Required Braces or Orthoses: Sling;Splint/Cast Splint/Cast: sling without splint today Restrictions Weight Bearing Restrictions: Yes RUE Weight Bearing: Weight  bearing as tolerated RLE Weight Bearing: Weight bearing as tolerated     Mobility  Bed Mobility               General bed mobility comments: up in chair when PT arrived    Transfers Overall transfer level: Needs assistance Equipment used: Right platform walker Transfers: Sit to/from Stand Sit to Stand: Min guard           General transfer comment: pt can stand with LUE at chair but with walker needs a bit of assist    Ambulation/Gait Ambulation/Gait assistance: Min guard, Min assist Gait Distance (Feet): 150 Feet Assistive device: Rolling walker (2 wheels) Gait Pattern/deviations: Step-through pattern, Decreased stride length, Wide base of support Gait velocity: reduced Gait velocity interpretation: <1.31 ft/sec, indicative of household ambulator Pre-gait activities: posture General Gait Details: pt is more careful with turns with splint off LUE, talked with her about avoiding the issues covered by MD   Stairs             Wheelchair Mobility    Modified Rankin (Stroke Patients Only)       Balance Overall balance assessment: Needs assistance   Sitting balance-Leahy Scale: Good       Standing balance-Leahy Scale: Fair                              Cognition Arousal/Alertness: Awake/alert Behavior During Therapy: WFL for tasks assessed/performed Overall Cognitive Status: Within Functional Limits for tasks assessed                                          Exercises  General Exercises - Lower Extremity Ankle Circles/Pumps: AROM, 5 reps Quad Sets: AROM, 10 reps Gluteal Sets: AROM, 10 reps Long Arc Quad: AROM, 10 reps Heel Slides: AROM, 10 reps    General Comments General comments (skin integrity, edema, etc.): pt is up to walk and asked her to "drive" walker with LUE exclusively and RUE to just perch      Pertinent Vitals/Pain Pain Assessment Pain Assessment: Faces Pain Score: 2  Pain Location: R UE Pain  Descriptors / Indicators: Sore, Guarding Pain Intervention(s): Monitored during session, Repositioned    Home Living                          Prior Function            PT Goals (current goals can now be found in the care plan section) Acute Rehab PT Goals Patient Stated Goal: get  home when ready Progress towards PT goals: Progressing toward goals    Frequency    Min 5X/week      PT Plan Current plan remains appropriate    Co-evaluation              AM-PAC PT "6 Clicks" Mobility   Outcome Measure  Help needed turning from your back to your side while in a flat bed without using bedrails?: A Little Help needed moving from lying on your back to sitting on the side of a flat bed without using bedrails?: A Little Help needed moving to and from a bed to a chair (including a wheelchair)?: A Little Help needed standing up from a chair using your arms (e.g., wheelchair or bedside chair)?: A Little Help needed to walk in hospital room?: A Little Help needed climbing 3-5 steps with a railing? : A Lot 6 Click Score: 17    End of Session Equipment Utilized During Treatment: Gait belt Activity Tolerance: Patient tolerated treatment well Patient left: in chair;with call bell/phone within reach;with chair alarm set;with family/visitor present Nurse Communication: Mobility status;Precautions PT Visit Diagnosis: Unsteadiness on feet (R26.81);Muscle weakness (generalized) (M62.81);Pain Pain - Right/Left: Right Pain - part of body: Arm     Time: 1410-1439 PT Time Calculation (min) (ACUTE ONLY): 29 min  Charges:  $Gait Training: 8-22 mins $Therapeutic Exercise: 8-22 mins     Ivar Drape 04/24/2023, 7:41 PM  Samul Dada, PT PhD Acute Rehab Dept. Number: [facility] [id number] and [facility] [phone]

## 2023-04-24 NOTE — Discharge Summary (Signed)
DISCHARGE SUMMARY  ADELIE JOHANN  MR#: [id number]  DOB:1956-08-17  Date of Admission: 04/17/2023 Date of Discharge: 04/24/2023  Attending Physician:Michaelyn Wall Silvestre Gunner, MD  Patient's NWG:[id number], Provider Not In  Consults: Orthopedic Surgery   Disposition: D/C to SNF for rehab stay   Follow-up Appts:  Follow-up Information     Myrene Galas, MD. Schedule an appointment as soon as possible for a visit in 10 day(s).   Specialty: Orthopedic Surgery Contact information: [address] [place] [address] [phone]                 Discharge Diagnoses: Right elbow/distal humerus fracture Pelvic fracture Near syncope Chronic OSA  Initial presentation: 67 year old with a history of anxiety/depression and GERD who suffered a fall with resultant elbow and pelvic pain. CT of the right elbow revealed a distal humerus fracture with anterior radial head dislocation/subluxation. CT of the pelvis revealed right pubic rami fractures and a minimally displaced right sacral ala.   Hospital Course:  Right elbow/distal humerus fracture Underwent ORIF per Orthopedic Surgery 04/18/2023 - she will need a SNF rehab stay -medically ready for disposition as soon as bed is identified - postoperative care per Orthopedics as follows:  Weightbearing No lifting with R arm  Ok to [employer] using platform    ROM/Activity No elbow motion yet as she is splinted Will start gentle motion in about 1 week    Wound care Splint x 1 week then start regular wound care    Pelvic fracture Orthopedics felt that repeat imaging actually revealed a fracture all the way through the ala in addition to the more obvious anterior displaced buckle fragment - now s/p sacroiliac screw fixation 04/18/23 - postoperative care per Orthopedics as follow:  WBAT R leg with walker  No motion restrictions  DVT/PE prophylaxis: Eliquis x 30 days  F/U with Ortho in 10 days   Near syncope This preceded the patient's fall -  she had just completed a 3[place] - PE and DVT ruled out - no findings on telemetry monitoring - TTE unrevealing - has a remote history of paroxysmal tachycardia therefore if symptoms recur could consider event monitor -no further inpatient workup indicated at this time   Chronic OSA Has chosen not to utilize CPAP during her inpatient stay    Allergies as of 04/24/2023   No Known Allergies      Medication List     STOP taking these medications    sucralfate 1 g tablet Commonly known as: CARAFATE       TAKE these medications    acetaminophen 325 MG tablet Commonly known as: TYLENOL Take 2 tablets (650 mg total) by mouth every 8 (eight) hours as needed.   apixaban 2.5 MG Tabs tablet Commonly known as: ELIQUIS Take 1 tablet (2.5 mg total) by mouth 2 (two) times daily.   busPIRone 10 MG tablet Commonly known as: BUSPAR Take 10 mg by mouth 3 (three) times daily.   cetirizine 10 MG tablet Commonly known as: ZYRTEC Take 10 mg by mouth daily.   escitalopram 10 MG tablet Commonly known as: LEXAPRO Take 10 mg by mouth daily.   ipratropium 0.03 % nasal spray Commonly known as: ATROVENT Place 2 sprays into both nostrils every 12 (twelve) hours.   metoCLOPramide 10 MG tablet Commonly known as: REGLAN Take 10 mg by mouth 3 (three) times daily as needed for nausea or vomiting.   Nasacort Allergy 24HR 55 MCG/ACT Aero nasal  inhaler Generic drug: triamcinolone Place 1 spray into the nose daily.   oxyCODONE-acetaminophen 7.5-325 MG tablet Commonly known as: Percocet Take 1-1.5 tablets by mouth every 8 (eight) hours as needed for severe pain.   pantoprazole 40 MG tablet Commonly known as: PROTONIX Take 40 mg by mouth 2 (two) times daily.   polyethylene glycol 17 g packet Commonly known as: MIRALAX / GLYCOLAX Take 17 g by mouth daily. Start taking on: April 25, 2023   rOPINIRole 3 MG tablet Commonly known as: REQUIP Take 3 mg by mouth at  bedtime.   rosuvastatin 10 MG tablet Commonly known as: CRESTOR Take 10 mg by mouth daily.   senna-docusate 8.6-50 MG tablet Commonly known as: Senokot-S Take 1 tablet by mouth at bedtime as needed for mild constipation.   Vitamin D 125 MCG (5000 UT) Caps Take 1 capsule by mouth daily.        Day of Discharge BP (!) 141/80 (BP Location: Left Arm)   Pulse 79   Temp 98.4 F (36.9 C) (Oral)   Resp 16   Ht 5\' 3"  (1.6 m)   Wt 77.1 kg   SpO2 99%   BMI 30.11 kg/m   Physical Exam: General: No acute respiratory distress Lungs: Clear to auscultation bilaterally without wheezes or crackles Cardiovascular: Regular rate and rhythm without murmur gallop or rub normal S1 and S2 Abdomen: Nontender, nondistended, soft, bowel sounds positive, no rebound, no ascites, no appreciable mass Extremities: No significant cyanosis, clubbing, or edema bilateral lower extremities  Basic Metabolic Panel: Recent Labs  Lab 04/21/23 0810  NA 140  K 3.5  CL 106  CO2 26  GLUCOSE 99  BUN 8  CREATININE 0.64  CALCIUM 9.0  MG 2.0  PHOS 3.2    CBC: Recent Labs  Lab 04/18/23 0215 04/19/23 0217 04/21/23 0810  WBC 9.3 9.9 7.3  HGB 11.4* 10.8* 10.6*  HCT 33.3* 32.7* 31.2*  MCV 94.1 96.2 95.4  PLT 154 165 206    Time spent in discharge (includes decision making & examination of pt): 35 minutes  04/24/2023, 12:07 PM   Lonia Blood, MD [facility] Office  [phone]

## 2023-04-30 NOTE — Op Note (Signed)
NAMEYMA, MCALOON MEDICAL RECORD NO: [id number] ACCOUNT NO: [id number] DATE OF BIRTH: 10-01-1956 FACILITY: [facility] LOCATION: [facility] PHYSICIAN: Doralee Albino. Carola Frost, MD  Operative Report   DATE OF PROCEDURE: 04/18/2023  PREOPERATIVE DIAGNOSES:   1.  Right olecranon fracture dislocation. 2.  Right-sided complete sacral fracture with failure to mobilize.  POSTOPERATIVE DIAGNOSES: 1.  Right olecranon fracture dislocation. 2.  Right-sided complete sacral fracture with failure to mobilize.  PROCEDURES: 1.  Open treatment of right elbow dislocation. 2.  ORIF of right olecranon. 3.  Sacroiliac screw fixation of the right hemipelvis.  SURGEON:  Doralee Albino. Carola Frost, MD.  ASSISTANT:  None.  ANESTHESIA:  General.  COMPLICATIONS:  None.  TOURNIQUET:  None.  PATIENT DISPOSITION:  To PACU.  CONDITION:  Stable.  BRIEF SUMMARY OF INDICATIONS FOR PROCEDURE:  The patient is a very pleasant 67 year old female who had a syncopal event with a fall and acute right elbow and pelvic pain.  She was initially seen and evaluated by Dr. Deeann Saint at [facility].  Because of the  associated injuries and complexity of the fracture dislocation of the elbow Dr. Hyacinth Meeker asserted this was outside his scope of practice and that these injuries would be best cared for by fellowship trained orthopedic pathologist.  Consequently, I was  consulted.  The patient was transferred to our facility for definitive treatment.  I did discuss with her the risks and benefits of surgical stabilization including the potential for nerve injury, vessel injury, loss of motion of the elbow, foot drop,  involving nerve injury with the pelvis, symptomatic hardware and the possibility of future removal as well as others.  After acknowledging these risks she did provide consent to proceed.  BRIEF SUMMARY OF PROCEDURE:  The patient was taken to the operating room where general anesthesia was induced.  She was positioned supine and a chlorhexidine  wash, Betadine scrub and paint performed of her right flank.  I then brought in the C-arm to  identify the correct starting point and trajectory for a right sided SI screw.  I then converted to inlet and outlet films to watch the threaded guidewire advance into the center of the S1 vertebral body.  I then placed a [employer] screw with washer  achieving a good fixation and stabilization across the fracture.  Wounds were irrigated thoroughly and then closed in a standard fashion.  Attention was then turned to the right elbow.  Here again a chlorhexidine wash, Betadine scrub and paint was performed.  A timeout was held after draping.  I began with a dorsal incision continued dissection down to the fracture site.  Because of the  comminution, I was able to access the ulnohumeral joint and remove fragments in addition to a hematoma and then performed a provisional reduction with tenaculums and K-wires.  Once this was complete, I then applied the Acumed plate and secured definitive  fixation with screws.  I supplemented the fixation with a suture repair and then thorough irrigation was performed.  The elbow was taken through a range of motion after the open reduction of the ulnohumeral joint and this was found to be stable over the  arc of motion.  I then again performed a standard layer closure and applied a gently compressive dressing and splint.  The patient was then taken to the PACU in stable condition.  PROGNOSIS:  The patient will be in the splint for 1 week returning for removal and initiation of motion and then 1 week later for removal  of sutures.  She will be weightbearing as tolerated on the lower extremities.  She has no range of motion  precautions.  The risks for a syncopal workup remains in progress.   PUS D: 04/30/2023 12:31:57 pm T: 04/30/2023 1:05:00 pm  JOB: [id number]/ [id number]

## 2023-04-30 NOTE — Brief Op Note (Signed)
04/18/2023  12:31 PM  PATIENT:  Christine Arias  67 y.o. female  PRE-OPERATIVE DIAGNOSIS:  RIGHT ELBOW FX, SI FRACTURE  POST-OPERATIVE DIAGNOSIS:  RIGHT ELBOW FX, SI FRACTURE  PROCEDURE:  Procedure(s): OPEN REDUCTION INTERNAL FIXATION (ORIF) ELBOW/OLECRANON FRACTURE (Right) SACRO-ILIAC PINNING RIGHT (Right)  SURGEON:  Surgeon(s) and Role:    Myrene Galas, MD - Primary [phone]

## 2023-05-11 ENCOUNTER — Encounter (HOSPITAL_COMMUNITY): Payer: Self-pay | Admitting: Orthopedic Surgery
